# Patient Record
Sex: Male | Born: 1970 | Race: White | Hispanic: No | Marital: Married | Smoking: Never smoker
Health system: Southern US, Community
[De-identification: ages and names within clinical notes are randomized; demographics above are authoritative.]

## PROBLEM LIST (undated history)

## (undated) DIAGNOSIS — K219 Gastro-esophageal reflux disease without esophagitis: Secondary | ICD-10-CM

## (undated) DIAGNOSIS — J45909 Unspecified asthma, uncomplicated: Secondary | ICD-10-CM

## (undated) DIAGNOSIS — E785 Hyperlipidemia, unspecified: Secondary | ICD-10-CM

## (undated) DIAGNOSIS — Z8719 Personal history of other diseases of the digestive system: Secondary | ICD-10-CM

## (undated) DIAGNOSIS — T7840XA Allergy, unspecified, initial encounter: Secondary | ICD-10-CM

## (undated) HISTORY — PX: OTHER SURGICAL HISTORY: SHX169

## (undated) HISTORY — DX: Allergy, unspecified, initial encounter: T78.40XA

## (undated) HISTORY — DX: Unspecified asthma, uncomplicated: J45.909

## (undated) HISTORY — DX: Personal history of other diseases of the digestive system: Z87.19

## (undated) HISTORY — DX: Gastro-esophageal reflux disease without esophagitis: K21.9

## (undated) HISTORY — DX: Hyperlipidemia, unspecified: E78.5

---

## 2006-04-05 ENCOUNTER — Ambulatory Visit: Payer: Self-pay | Admitting: Internal Medicine

## 2006-04-08 ENCOUNTER — Ambulatory Visit: Payer: Self-pay | Admitting: Gastroenterology

## 2006-04-10 ENCOUNTER — Ambulatory Visit: Payer: Self-pay | Admitting: Gastroenterology

## 2006-05-09 ENCOUNTER — Ambulatory Visit: Payer: Self-pay | Admitting: Gastroenterology

## 2006-05-10 ENCOUNTER — Ambulatory Visit: Payer: Self-pay | Admitting: Gastroenterology

## 2006-07-09 ENCOUNTER — Ambulatory Visit: Payer: Self-pay | Admitting: Gastroenterology

## 2006-07-10 ENCOUNTER — Ambulatory Visit: Payer: Self-pay | Admitting: Gastroenterology

## 2007-01-23 ENCOUNTER — Ambulatory Visit: Payer: Self-pay | Admitting: Internal Medicine

## 2007-07-04 ENCOUNTER — Ambulatory Visit: Payer: Self-pay | Admitting: Gastroenterology

## 2007-07-04 LAB — CONVERTED CEMR LAB: Total Protein: 6.9 g/dL (ref 6.0–8.3)

## 2007-07-09 ENCOUNTER — Ambulatory Visit: Payer: Self-pay | Admitting: Gastroenterology

## 2007-07-15 ENCOUNTER — Ambulatory Visit: Payer: Self-pay | Admitting: Internal Medicine

## 2007-08-15 ENCOUNTER — Ambulatory Visit: Payer: Self-pay | Admitting: Internal Medicine

## 2007-08-15 LAB — CONVERTED CEMR LAB
ALT: 43 units/L (ref 0–53)
Alkaline Phosphatase: 84 units/L (ref 39–117)
HDL: 29.4 mg/dL — ABNORMAL LOW (ref >39.0)
LDL Cholesterol: 125 mg/dL — ABNORMAL HIGH (ref 0–99)
Triglycerides: 108 mg/dL (ref 0–149)
VLDL: 22 mg/dL (ref 0–40)

## 2007-10-07 ENCOUNTER — Encounter: Payer: Self-pay | Admitting: Internal Medicine

## 2007-10-07 DIAGNOSIS — Z8639 Personal history of other endocrine, nutritional and metabolic disease: Secondary | ICD-10-CM

## 2007-10-07 DIAGNOSIS — Z862 Personal history of diseases of the blood and blood-forming organs and certain disorders involving the immune mechanism: Secondary | ICD-10-CM

## 2008-03-16 ENCOUNTER — Ambulatory Visit: Payer: Self-pay | Admitting: Gastroenterology

## 2008-03-16 LAB — CONVERTED CEMR LAB
ALT: 46 U/L
AST: 26 U/L
Albumin: 3.9 g/dL
Alkaline Phosphatase: 81 U/L
Bilirubin, Direct: 0.1 mg/dL
Total Bilirubin: 0.9 mg/dL
Total Protein: 7.5 g/dL

## 2008-04-01 DIAGNOSIS — K7689 Other specified diseases of liver: Secondary | ICD-10-CM | POA: Insufficient documentation

## 2008-04-01 DIAGNOSIS — K760 Fatty (change of) liver, not elsewhere classified: Secondary | ICD-10-CM | POA: Insufficient documentation

## 2009-01-03 ENCOUNTER — Ambulatory Visit: Payer: Self-pay | Admitting: Internal Medicine

## 2009-01-03 DIAGNOSIS — K219 Gastro-esophageal reflux disease without esophagitis: Secondary | ICD-10-CM | POA: Insufficient documentation

## 2009-02-17 ENCOUNTER — Ambulatory Visit: Payer: Self-pay | Admitting: Internal Medicine

## 2009-02-22 LAB — CONVERTED CEMR LAB
Albumin: 4.2 g/dL (ref 3.5–5.2)
Basophils Relative: 0.3 % (ref 0.0–3.0)
Bilirubin Urine: NEGATIVE
Bilirubin, Direct: 0.2 mg/dL (ref 0.0–0.3)
CO2: 31 meq/L (ref 19–32)
Chloride: 104 meq/L (ref 96–112)
Direct LDL: 138.7 mg/dL
Eosinophils Absolute: 0.1 10*3/uL (ref 0.0–0.7)
Eosinophils Relative: 1.7 % (ref 0.0–5.0)
GFR calc Af Amer: 123 mL/min
GFR calc non Af Amer: 101 mL/min
Ketones, ur: NEGATIVE mg/dL
Lymphocytes Relative: 34.5 % (ref 12.0–46.0)
MCHC: 35.1 g/dL (ref 30.0–36.0)
MCV: 84.8 fL (ref 78.0–100.0)
Monocytes Absolute: 0.6 10*3/uL (ref 0.1–1.0)
Monocytes Relative: 7.4 % (ref 3.0–12.0)
Neutro Abs: 4.2 10*3/uL (ref 1.4–7.7)
Nitrite: NEGATIVE
RDW: 12.2 % (ref 11.5–14.6)
Sodium: 141 meq/L (ref 135–145)
Total Bilirubin: 1.1 mg/dL (ref 0.3–1.2)
Total CHOL/HDL Ratio: 6.1
Total Protein, Urine: NEGATIVE mg/dL
Triglycerides: 127 mg/dL (ref 0–149)
Urine Glucose: NEGATIVE mg/dL
pH: 6.5 (ref 5.0–8.0)

## 2009-03-03 ENCOUNTER — Telehealth: Payer: Self-pay | Admitting: Internal Medicine

## 2009-05-19 ENCOUNTER — Ambulatory Visit: Payer: Self-pay | Admitting: Internal Medicine

## 2009-11-18 ENCOUNTER — Ambulatory Visit: Payer: Self-pay | Admitting: Internal Medicine

## 2009-11-18 LAB — CONVERTED CEMR LAB
ALT: 34 units/L (ref 0–53)
BUN: 9 mg/dL (ref 6–23)
Bilirubin, Direct: 0.2 mg/dL (ref 0.0–0.3)
CO2: 28 meq/L (ref 19–32)
Chloride: 105 meq/L (ref 96–112)
Creatinine, Ser: 1 mg/dL (ref 0.4–1.5)
Glucose, Bld: 80 mg/dL (ref 70–99)
HCV Ab: NEGATIVE
HDL: 35.1 mg/dL — ABNORMAL LOW (ref >39.00)
Hep A IgM: NEGATIVE
Hep B C IgM: NEGATIVE
Hepatitis B Surface Ag: NEGATIVE
Potassium: 3.7 meq/L (ref 3.5–5.1)

## 2009-11-22 ENCOUNTER — Ambulatory Visit: Payer: Self-pay | Admitting: Internal Medicine

## 2009-11-22 DIAGNOSIS — R21 Rash and other nonspecific skin eruption: Secondary | ICD-10-CM

## 2009-11-22 DIAGNOSIS — E785 Hyperlipidemia, unspecified: Secondary | ICD-10-CM | POA: Insufficient documentation

## 2009-11-24 DIAGNOSIS — J309 Allergic rhinitis, unspecified: Secondary | ICD-10-CM | POA: Insufficient documentation

## 2010-06-06 ENCOUNTER — Ambulatory Visit: Payer: Self-pay | Admitting: Internal Medicine

## 2010-06-06 DIAGNOSIS — J45901 Unspecified asthma with (acute) exacerbation: Secondary | ICD-10-CM

## 2010-06-06 DIAGNOSIS — R0602 Shortness of breath: Secondary | ICD-10-CM

## 2010-11-24 ENCOUNTER — Ambulatory Visit: Payer: Self-pay | Admitting: Internal Medicine

## 2010-11-24 LAB — CONVERTED CEMR LAB
ALT: 45 units/L (ref 0–53)
AST: 23 units/L (ref 0–37)
Albumin: 4.1 g/dL (ref 3.5–5.2)
Basophils Absolute: 0 10*3/uL (ref 0.0–0.1)
Bilirubin, Direct: 0.1 mg/dL (ref 0.0–0.3)
CO2: 28 meq/L (ref 19–32)
Cholesterol: 214 mg/dL — ABNORMAL HIGH (ref 0–200)
Direct LDL: 147.8 mg/dL
Eosinophils Absolute: 0.2 10*3/uL (ref 0.0–0.7)
Eosinophils Relative: 2.2 % (ref 0.0–5.0)
HDL: 34 mg/dL — ABNORMAL LOW (ref >39.00)
Hemoglobin: 16 g/dL (ref 13.0–17.0)
Leukocytes, UA: NEGATIVE
Lymphs Abs: 1.9 10*3/uL (ref 0.7–4.0)
Monocytes Relative: 8.1 % (ref 3.0–12.0)
Potassium: 4.1 meq/L (ref 3.5–5.1)
Sodium: 139 meq/L (ref 135–145)
Total Bilirubin: 1 mg/dL (ref 0.3–1.2)
Total CHOL/HDL Ratio: 6
Total Protein, Urine: NEGATIVE mg/dL
Triglycerides: 141 mg/dL (ref 0.0–149.0)
Urine Glucose: NEGATIVE mg/dL
Urobilinogen, UA: 0.2 (ref 0.0–1.0)
VLDL: 28.2 mg/dL (ref 0.0–40.0)
pH: 6.5 (ref 5.0–8.0)

## 2010-11-27 ENCOUNTER — Ambulatory Visit: Payer: Self-pay | Admitting: Internal Medicine

## 2011-01-16 NOTE — Assessment & Plan Note (Signed)
Summary: BREATHING PROBLEM/NWS   Vital Signs:  Patient profile:   40 year old male Weight:      174 pounds BMI:     30.93 O2 Sat:      96 % on Room air Temp:     98.7 degrees F oral Pulse rate:   98 / minute BP sitting:   138 / 90  (left arm) Cuff size:   regular  Vitals Entered By: Lamar Sprinkles, CMA (June 06, 2010 8:57 AM)  O2 Flow:  Room air CC: SOB since last night after painting a wall in a closed room   CC:  SOB since last night after painting a wall in a closed room.  History of Present Illness: C/o SOB He was painting a wall w/acryl. paint last night and got very SOB and wheezy. He has spent all night outside; he was unable to lie down.Marland KitchenMarland KitchenC/o some cough. No CP.  Allergies: No Known Drug Allergies  Past History:  Past Surgical History: Last updated: 04/01/2008 Left leg surgery/ Left hip surgery.  Social History: Last updated: 01/03/2009 Occupation: Married w 1 son Never Smoked Alcohol use-no Regular exercise-yes  Past Medical History: Hx of FATTY LIVER DISEASE (ICD-571.8) LIVER FUNCTION TESTS, ABNORMAL, HX OF (ICD-V12.2) Dr Christella Hartigan FAMILY HISTORY-MOTHER BREAST CANCER GERD mild Hyperlipidemia, mild Allergic rhinitis Asthmatic bronchitis acute due to Latex paint  Review of Systems  The patient denies fever, chest pain, syncope, dyspnea on exertion, and abdominal pain.    Physical Exam  General:  Im mild resp distress Nose:  WNL Mouth:  WNL Neck:  No deformities, masses, or tenderness noted. Lungs:  B rhonchi Heart:  Normal rate and regular rhythm. S1 and S2 normal without gallop, murmur, click, rub or other extra sounds. Abdomen:  Bowel sounds positive,abdomen soft and non-tender without masses, organomegaly or hernias noted. Skin:  no cyanosis Psych:  Oriented X3.     Impression & Recommendations:  Problem # 1:  DYSPNEA (ICD-786.05) due to #2 Assessment New Better after HHN Orders: T-2 View CXR, Same Day (71020.5TC) Depo- Medrol 80mg   (J1040) Depo- Medrol 40mg  (J1030) Admin of Therapeutic Inj  intramuscular or subcutaneous (81191) Nebulizer Tx (47829)  Problem # 2:  ASTHMA UNSPECIFIED WITH EXACERBATION (FAO-130.86) Assessment: New  His updated medication list for this problem includes:    Symbicort 160-4.5 Mcg/act Aero (Budesonide-formoterol fumarate) .Marland Kitchen... 2 inh two times a day    Proair Hfa 108 (90 Base) Mcg/act Aers (Albuterol sulfate) .Marland Kitchen... 2 inh qid as needed  Complete Medication List: 1)  Lovaza 1 Gm Caps (Omega-3-acid ethyl esters) .... 2 two times a day 2)  Loratadine 10 Mg Tabs (Loratadine) .Marland Kitchen.. 1 by mouth once daily as needed allergies 3)  Flonase 50 Mcg/act Susp (Fluticasone propionate) .Marland Kitchen.. 1-2 spr each nostril once daily for allergies 4)  Lovaza 1 Gm Caps (Omega-3-acid ethyl esters) .... 2 by mouth bid 5)  Vitamin D 1000 Unit Tabs (Cholecalciferol) .Marland Kitchen.. 1 by mouth qd 6)  Triamcinolone Acetonide 0.5 % Crea (Triamcinolone acetonide) .... Use two times a day as needed for rash 7)  Symbicort 160-4.5 Mcg/act Aero (Budesonide-formoterol fumarate) .... 2 inh two times a day 8)  Proair Hfa 108 (90 Base) Mcg/act Aers (Albuterol sulfate) .... 2 inh qid as needed  Patient Instructions: 1)  RTC 2 d 2)  Call if you are not better in a reasonable amount of time or if worse. Go to ER if feeling really bad! Prescriptions: PROAIR HFA 108 (90 BASE) MCG/ACT AERS (ALBUTEROL SULFATE)  2 inh qid as needed  #3 x 3   Entered and Authorized by:   Tresa Garter MD   Signed by:   Tresa Garter MD on 06/06/2010   Method used:   Print then Give to Patient   RxID:   [id number] SYMBICORT 160-4.5 MCG/ACT AERO (BUDESONIDE-FORMOTEROL FUMARATE) 2 inh two times a day  #1 x 3   Entered and Authorized by:   Tresa Garter MD   Signed by:   Tresa Garter MD on 06/06/2010   Method used:   Print then Give to Patient   RxID:   [id number]    Medication Administration  Injection # 1:    Medication:  Depo- Medrol 80mg     Diagnosis: DYSPNEA (ICD-786.05)    Route: IM    Site: RUOQ gluteus    Exp Date: 03/17/2013    Lot #: [id number]    Mfr: Pharmacia    Comments: 120mg      Patient tolerated injection without complications    Given by: Lamar Sprinkles, CMA (June 06, 2010 11:46 AM)  Injection # 2:    Medication: Depo- Medrol 40mg     Diagnosis: DYSPNEA (ICD-786.05)    Comments: Same as above    Patient tolerated injection without complications    Given by: Lamar Sprinkles, CMA (June 06, 2010 11:47 AM)  Orders Added: 1)  T-2 View CXR, Same Day [71020.5TC] 2)  Depo- Medrol 80mg  [J1040] 3)  Depo- Medrol 40mg  [J1030] 4)  Admin of Therapeutic Inj  intramuscular or subcutaneous [96372] 5)  Nebulizer Tx [94640] 6)  Est. Patient Level IV [84696]

## 2011-01-18 NOTE — Assessment & Plan Note (Signed)
Summary: CPX / NWS  #   Vital Signs:  Patient profile:   40 year old male Height:      63 inches Weight:      179 pounds BMI:     31.82 Temp:     98.1 degrees F oral Pulse rate:   92 / minute Pulse rhythm:   regular Resp:     16 per minute BP sitting:   130 / 90  (left arm) Cuff size:   regular  Vitals Entered By: Lanier Prude, Beverly Gust) (November 27, 2010 2:05 PM) CC: CPX Is Patient Diabetic? No Comments pt is not taking Loratadine, Flonase, Triamcinolone or ProAir   CC:  CPX.  History of Present Illness: The patient presents for a preventive health examination   Current Medications (verified): 1)  Lovaza 1 Gm  Caps (Omega-3-Acid Ethyl Esters) .... 2 Two Times A Day 2)  Loratadine 10 Mg Tabs (Loratadine) .Marland Kitchen.. 1 By Mouth Once Daily As Needed Allergies 3)  Flonase 50 Mcg/act Susp (Fluticasone Propionate) .Marland Kitchen.. 1-2 Spr Each Nostril Once Daily For Allergies 4)  Lovaza 1 Gm Caps (Omega-3-Acid Ethyl Esters) .... 2 By Mouth Bid 5)  Vitamin D 1000 Unit Tabs (Cholecalciferol) .Marland Kitchen.. 1 By Mouth Qd 6)  Triamcinolone Acetonide 0.5 % Crea (Triamcinolone Acetonide) .... Use Two Times A Day As Needed For Rash 7)  Symbicort 160-4.5 Mcg/act Aero (Budesonide-Formoterol Fumarate) .... 2 Inh Two Times A Day As Needed 8)  Proair Hfa 108 (90 Base) Mcg/act Aers (Albuterol Sulfate) .... 2 Inh Qid As Needed  Allergies (verified): No Known Drug Allergies  Past History:  Past Medical History: Last updated: 06/06/2010 Hx of FATTY LIVER DISEASE (ICD-571.8) LIVER FUNCTION TESTS, ABNORMAL, HX OF (ICD-V12.2) Dr Christella Hartigan FAMILY HISTORY-MOTHER BREAST CANCER GERD mild Hyperlipidemia, mild Allergic rhinitis Asthmatic bronchitis acute due to Latex paint  Past Surgical History: Last updated: 04/01/2008 Left leg surgery/ Left hip surgery.  Family History: Last updated: 01/03/2009 No CVA, MIs  Social History: Occupation: Magazine features editor Married w 1 son Never Smoked Alcohol use-no Regular  exercise-yes - gym  Review of Systems  The patient denies anorexia, fever, weight loss, weight gain, vision loss, decreased hearing, hoarseness, chest pain, syncope, dyspnea on exertion, peripheral edema, prolonged cough, headaches, hemoptysis, abdominal pain, melena, hematochezia, severe indigestion/heartburn, hematuria, incontinence, genital sores, muscle weakness, suspicious skin lesions, transient blindness, difficulty walking, depression, unusual weight change, abnormal bleeding, enlarged lymph nodes, angioedema, and testicular masses.    Physical Exam  General:  Im mild resp distress Head:  Normocephalic and atraumatic without obvious abnormalities. No apparent alopecia or balding. Eyes:  No corneal or conjunctival inflammation noted. EOMI. Perrla.  Ears:  External ear exam shows no significant lesions or deformities.  Otoscopic examination reveals clear canals, tympanic membranes are intact bilaterally without bulging, retraction, inflammation or discharge. Hearing is grossly normal bilaterally. Nose:  External nasal examination shows no deformity or inflammation. Nasal mucosa are pink and moist without lesions or exudates. Mouth:  Oral mucosa and oropharynx without lesions or exudates.  Teeth in good repair. Neck:  No deformities, masses, or tenderness noted. Lungs:  Normal respiratory effort, chest expands symmetrically. Lungs are clear to auscultation, no crackles or wheezes. Heart:  Normal rate and regular rhythm. S1 and S2 normal without gallop, murmur, click, rub or other extra sounds. Abdomen:  Bowel sounds positive,abdomen soft and non-tender without masses, organomegaly or hernias noted. Genitalia:  self nl Msk:  No deformity or scoliosis noted of thoracic or lumbar spine.   Pulses:  R and L carotid,radial,femoral,dorsalis pedis and posterior tibial pulses are full and equal bilaterally Extremities:  No clubbing, cyanosis, edema, or deformity noted with normal full range of  motion of all joints.   Neurologic:  No cranial nerve deficits noted. Station and gait are normal. Plantar reflexes are down-going bilaterally. DTRs are symmetrical throughout. Sensory, motor and coordinative functions appear intact. Skin:  Intact without suspicious lesions or rashes Cervical Nodes:  No lymphadenopathy noted Psych:  Cognition and judgment appear intact. Alert and cooperative with normal attention span and concentration. No apparent delusions, illusions, hallucinations   Impression & Recommendations:  Problem # 1:  WELL ADULT EXAM (ICD-V70.0) Assessment New Health and age related issues were discussed. Available screening tests and vaccinations were discussed as well. Healthy life style including good diet and exercise was discussed.  The labs were reviewed with the patient.   Problem # 2:  HYPERLIPIDEMIA (ICD-272.4) Assessment: Unchanged He is not interested in statins The following medications were removed from the medication list:    Lovaza 1 Gm Caps (Omega-3-acid ethyl esters) .Marland Kitchen... 2 two times a day His updated medication list for this problem includes:    Lovaza 1 Gm Caps (Omega-3-acid ethyl esters) .Marland Kitchen... 2 by mouth bid  Problem # 3:  ALLERGIC RHINITIS (ICD-477.9) Assessment: Improved  The following medications were removed from the medication list:    Loratadine 10 Mg Tabs (Loratadine) .Marland Kitchen... 1 by mouth once daily as needed allergies    Flonase 50 Mcg/act Susp (Fluticasone propionate) .Marland Kitchen... 1-2 spr each nostril once daily for allergies  Problem # 4:  LIVER FUNCTION TESTS, ABNORMAL, HX OF (ICD-V12.2) Assessment: Improved  Complete Medication List: 1)  Lovaza 1 Gm Caps (Omega-3-acid ethyl esters) .... 2 by mouth bid 2)  Vitamin D 1000 Unit Tabs (Cholecalciferol) .Marland Kitchen.. 1 by mouth qd 3)  Omeprazole 40 Mg Cpdr (Omeprazole) .Marland Kitchen.. 1 by mouth qam for indigestion  Patient Instructions: 1)  Please schedule a follow-up appointment in 6 months. 2)  BMP prior to visit,  ICD-9: 3)  Hepatic Panel prior to visit, ICD-9: 4)  Lipid Panel prior to visit, ICD-9:272.20  995.20 Prescriptions: OMEPRAZOLE 40 MG CPDR (OMEPRAZOLE) 1 by mouth qam for indigestion  #30 x 3   Entered and Authorized by:   Tresa Garter MD   Signed by:   Tresa Garter MD on 11/27/2010   Method used:   Print then Give to Patient   RxID:   [id number] SYMBICORT 160-4.5 MCG/ACT AERO (BUDESONIDE-FORMOTEROL FUMARATE) 2 inh two times a day as needed  #1 x 3   Entered and Authorized by:   Tresa Garter MD   Signed by:   Tresa Garter MD on 11/27/2010   Method used:   Print then Give to Patient   RxID:   [id number] LOVAZA 1 GM  CAPS (OMEGA-3-ACID ETHYL ESTERS) 2 two times a day  #120 x 12   Entered and Authorized by:   Tresa Garter MD   Signed by:   Tresa Garter MD on 11/27/2010   Method used:   Print then Give to Patient   RxID:   [id number]    Orders Added: 1)  Est. Patient age 18-39 [99395]

## 2011-05-01 NOTE — Assessment & Plan Note (Signed)
HEALTHCARE                         GASTROENTEROLOGY OFFICE NOTE   NAME:[name], [name]                        MRN:          [id number]  DATE:07/09/2007                            DOB:          April 05, 1971    GI PROBLEM LIST:  1. Abnormal liver tests.  First noted abnormal transaminases in 2004.  In 2007 his AST was 81, his  ALT was 71, GGT was 271, otherwise normal.  May 2007:  AST of 60, ALT of  244.  Normal right upper quadrant ultrasound and normal appearing liver  and no gallstones April 2007.  Labs April 2007 show hepatitis A, B, C,  negative, alpha antitrypsin normal, ceruloplasmin normal, ANA negative,  AMA negative, antismooth muscle antibody negative, iron studies normal,  thyroid studies normal.  Liver tests improved as patient lost weight.  Transaminase July 2007 after a 10 to 15 pound weight loss show ALT 42,  AST 26.   INTERVAL HISTORY:  I saw [name] about a year ago.  He had been losing  weight, and his liver tests responded quite well.  I suspect that he  indeed had mild fatty liver disease.  He had some blood tested at work  recently on an elective basis, and it showed an ALT of 171, an AST of  55, and GGT of 234.  [name], [name] (each of these transaminases  are 2 units above normal).  Other liver tests were normal last week.  He  has had no jaundice.  No abdominal pains.  No biliary symptoms.  He  takes a multivitamin once a day.  Otherwise, no herbs, no vitamins, no  Tylenol.   CURRENT MEDICATIONS:  None.   PHYSICAL EXAMINATION:  Weight 164 pounds, which is down 1 pound in the  past year.  Blood pressure 108/78.  Pulse 72.  CONSTITUTIONAL:  Generally well appearing.  Anicteric sclerae.  ABDOMEN:  Soft, nontender, non-distended, normal bowel sounds.   ASSESSMENT AND PLAN:  A 40 year old man with abnormal liver tests.   His AST, ALT, and GGT checked at  work 2 weeks ago were quite elevated.  Rechecked here at [facility], they were just barely above normal.  Fatty  liver disease should not cause such a waxing and waning of liver tests.  Biliary processes can, but he has normal right upper quadrant  ultrasound, and has had no biliary-type pains.  This makes me wonder  whether the work liver tests were indeed accurate.  He has had a very  thorough workup all summarized above, and I do not see any reason to  repeat that yet.  However, I will arrange for him to have hepatic  profile  done again in 1 month from now.  If his liver tests are significantly  elevated again, I will arrange for a repeat ultrasound.  Would also have  to consider liver biopsy at that point.     Richard Fee, MD  Electronically Signed    DPJ/MedQ  DD: 07/09/2007  DT:  07/09/2007  Job #: [id number]   cc:   Richard Quint. Plotnikov, MD

## 2011-05-04 NOTE — Assessment & Plan Note (Signed)
[facility]                           GASTROENTEROLOGY OFFICE NOTE   NAME:[name], [name]                        MRN:          [id number]  DATE:07/10/2006                            DOB:          September 04, 1971    GI PROBLEM LIST:  1.  Abnormal liver tests.  First noted abnormal transaminases in 2004.  In      2007, AST 81, ALT 71, GGT 271, normal otherwise.  May 2007, AST 60, ALT      244.   WORKUP TO DATE:  Normal right upper quadrant ultrasound without stones and  normal-appearing liver April 2007.  Labs April 2007 Hep A, B, C negative.  Alpha-1 antitrypsin level normal.  Ceruloplasmin normal.  ANA negative.  AMA  negative.  Anti-smooth muscle antibody normal.  Iron studies normal, thyroid  studies normal.   INTERVAL HISTORY:  I last saw [name] two months ago.  Given the pretty  significant elevation in his transaminases and negative workup, I recommend  that we proceed with liver biopsy.  He however wanted to try losing weight  for a few weeks to see if that made a difference in his liver tests.  He  comes in today weighing 10 pounds less by cutting out extra food and fats in  his diet.  His liver tests checked yesterday were much improved.  Specifically, his alkaline phosphatase was 86, his AST was 26, his ALT was  42.  These are all remarkably better than they were three months prior.   MEDICATIONS:  None.   PHYSICAL EXAMINATION:  VITAL SIGNS:  Weight 165 pounds (down 10 pounds since  last visit).  Blood pressure 110/60.  Pulse 68.  CONSTITUTIONAL:  Generally well appearing.  ABDOMEN:  Soft, non-tender, non-distended, normal bowel sounds.   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   [name] has done remarkably well with a weight loss program in the past two  months and his liver tests have dramatically improved.  I think this is very  good evidence that he indeed did have fatty liver  disease.  He will return  to see me in six months' time.  He said he wants to lose another 10-15  pounds, and I encouraged him to do that.  He is not obese but probably is  overweight.  He will return to see me therefore in 6 months' time.  He will  have his labs checked  the day prior and if at that point his liver tests are still looking as good  as they are now, then I think we could follow up with him on an as needed  basis only.                                   Rachael Fee, MD   DPJ/MedQ  DD:  07/10/2006  DT:  07/10/2006  Job #:  [id number]   cc:   Sonda Primes, MD

## 2011-05-22 ENCOUNTER — Other Ambulatory Visit: Payer: Self-pay

## 2011-05-22 ENCOUNTER — Other Ambulatory Visit: Payer: Self-pay | Admitting: Internal Medicine

## 2011-05-22 DIAGNOSIS — E782 Mixed hyperlipidemia: Secondary | ICD-10-CM

## 2011-05-22 DIAGNOSIS — T887XXA Unspecified adverse effect of drug or medicament, initial encounter: Secondary | ICD-10-CM

## 2011-05-28 ENCOUNTER — Ambulatory Visit: Payer: Self-pay | Admitting: Internal Medicine

## 2011-07-03 ENCOUNTER — Other Ambulatory Visit: Payer: Self-pay | Admitting: Internal Medicine

## 2011-07-31 ENCOUNTER — Other Ambulatory Visit (INDEPENDENT_AMBULATORY_CARE_PROVIDER_SITE_OTHER): Payer: BC Managed Care – PPO

## 2011-07-31 DIAGNOSIS — E782 Mixed hyperlipidemia: Secondary | ICD-10-CM

## 2011-07-31 DIAGNOSIS — T887XXA Unspecified adverse effect of drug or medicament, initial encounter: Secondary | ICD-10-CM

## 2011-07-31 LAB — BASIC METABOLIC PANEL
CO2: 28 mEq/L (ref 19–32)
Calcium: 9.2 mg/dL (ref 8.4–10.5)
GFR: 76.91 mL/min (ref >60.00)
Potassium: 4 mEq/L (ref 3.5–5.1)
Sodium: 141 mEq/L (ref 135–145)

## 2011-07-31 LAB — LIPID PANEL
HDL: 41.5 mg/dL (ref >39.00)
Total CHOL/HDL Ratio: 4
VLDL: 19.4 mg/dL (ref 0.0–40.0)

## 2011-07-31 LAB — HEPATIC FUNCTION PANEL
Alkaline Phosphatase: 74 U/L (ref 39–117)
Bilirubin, Direct: 0.1 mg/dL (ref 0.0–0.3)
Total Protein: 7.2 g/dL (ref 6.0–8.3)

## 2011-08-01 ENCOUNTER — Ambulatory Visit (INDEPENDENT_AMBULATORY_CARE_PROVIDER_SITE_OTHER): Payer: BC Managed Care – PPO | Admitting: Internal Medicine

## 2011-08-01 ENCOUNTER — Encounter: Payer: Self-pay | Admitting: Internal Medicine

## 2011-08-01 DIAGNOSIS — K219 Gastro-esophageal reflux disease without esophagitis: Secondary | ICD-10-CM

## 2011-08-01 DIAGNOSIS — D485 Neoplasm of uncertain behavior of skin: Secondary | ICD-10-CM | POA: Insufficient documentation

## 2011-08-01 DIAGNOSIS — K7689 Other specified diseases of liver: Secondary | ICD-10-CM

## 2011-08-01 DIAGNOSIS — E785 Hyperlipidemia, unspecified: Secondary | ICD-10-CM

## 2011-08-01 NOTE — Assessment & Plan Note (Signed)
Better  

## 2011-08-01 NOTE — Assessment & Plan Note (Signed)
LFTs are better 

## 2011-08-01 NOTE — Progress Notes (Signed)
  Subjective:    Patient ID: [name], male    DOB: July 25, 1971, 40 y.o.   MRN: [id number]  HPI  The patient presents for a follow-up of  chronic fatty liver, chronic dyslipidemia, GERD  C/o a spot on L shin x 2 years or so    Review of Systems  Constitutional: Negative for appetite change, fatigue and unexpected weight change.  HENT: Negative for nosebleeds, congestion, sore throat, sneezing, trouble swallowing and neck pain.   Eyes: Negative for itching and visual disturbance.  Respiratory: Negative for cough.   Cardiovascular: Negative for chest pain, palpitations and leg swelling.  Gastrointestinal: Negative for nausea, diarrhea, blood in stool and abdominal distention.  Genitourinary: Negative for frequency and hematuria.  Musculoskeletal: Negative for back pain, joint swelling and gait problem.  Skin: Negative for rash.  Neurological: Negative for dizziness, tremors, speech difficulty and weakness.  Psychiatric/Behavioral: Negative for sleep disturbance, dysphoric mood and agitation. The patient is not nervous/anxious.        Objective:   Physical Exam  Constitutional: He is oriented to person, place, and time. He appears well-developed.  HENT:  Mouth/Throat: Oropharynx is clear and moist.  Eyes: Conjunctivae are normal. Pupils are equal, round, and reactive to light.  Neck: Normal range of motion. No JVD present. No thyromegaly present.  Cardiovascular: Normal rate, regular rhythm, normal heart sounds and intact distal pulses.  Exam reveals no gallop and no friction rub.   No murmur heard. Pulmonary/Chest: Effort normal and breath sounds normal. No respiratory distress. He has no wheezes. He has no rales. He exhibits no tenderness.  Abdominal: Soft. Bowel sounds are normal. He exhibits no distension and no mass. There is no tenderness. There is no rebound and no guarding.  Musculoskeletal: Normal range of motion. He exhibits no edema and no tenderness.  Lymphadenopathy:     He has no cervical adenopathy.  Neurological: He is alert and oriented to person, place, and time. He has normal reflexes. No cranial nerve deficit. He exhibits normal muscle tone. Coordination normal.  Skin: Skin is warm and dry. No rash noted.       L shin lesion, flat  Psychiatric: He has a normal mood and affect. His behavior is normal. Judgment and thought content normal.     7x6 mm L dist shin     Assessment & Plan:

## 2011-08-01 NOTE — Assessment & Plan Note (Signed)
Likely benign. Bx offered - he declined. Lesion was measured.

## 2011-10-27 ENCOUNTER — Other Ambulatory Visit: Payer: Self-pay | Admitting: Internal Medicine

## 2012-02-01 ENCOUNTER — Encounter: Payer: BC Managed Care – PPO | Admitting: Internal Medicine

## 2012-02-06 ENCOUNTER — Other Ambulatory Visit: Payer: Self-pay

## 2012-02-06 MED ORDER — OMEGA-3-ACID ETHYL ESTERS 1 G PO CAPS: 2.0000 g | ORAL_CAPSULE | Freq: Two times a day (BID) | ORAL | Status: DC

## 2012-04-04 ENCOUNTER — Other Ambulatory Visit: Payer: Self-pay | Admitting: Internal Medicine

## 2012-04-11 ENCOUNTER — Encounter: Payer: BC Managed Care – PPO | Admitting: Internal Medicine

## 2012-06-24 ENCOUNTER — Encounter: Payer: BC Managed Care – PPO | Admitting: Internal Medicine

## 2012-08-28 ENCOUNTER — Other Ambulatory Visit (INDEPENDENT_AMBULATORY_CARE_PROVIDER_SITE_OTHER): Payer: BC Managed Care – PPO

## 2012-08-28 ENCOUNTER — Other Ambulatory Visit: Payer: Self-pay | Admitting: *Deleted

## 2012-08-28 DIAGNOSIS — Z0000 Encounter for general adult medical examination without abnormal findings: Secondary | ICD-10-CM

## 2012-08-28 DIAGNOSIS — Z79899 Other long term (current) drug therapy: Secondary | ICD-10-CM

## 2012-08-28 LAB — URINALYSIS, ROUTINE W REFLEX MICROSCOPIC
Ketones, ur: NEGATIVE
Leukocytes, UA: NEGATIVE
Nitrite: NEGATIVE
Specific Gravity, Urine: 1.02 (ref 1.000–1.030)
Total Protein, Urine: NEGATIVE
pH: 6 (ref 5.0–8.0)

## 2012-08-28 LAB — CBC WITH DIFFERENTIAL/PLATELET
Basophils Absolute: 0 10*3/uL (ref 0.0–0.1)
Basophils Relative: 0.7 % (ref 0.0–3.0)
Eosinophils Absolute: 0.2 10*3/uL (ref 0.0–0.7)
HCT: 46.6 % (ref 39.0–52.0)
Hemoglobin: 16 g/dL (ref 13.0–17.0)
Lymphs Abs: 3 10*3/uL (ref 0.7–4.0)
MCHC: 34.3 g/dL (ref 30.0–36.0)
Monocytes Relative: 7.1 % (ref 3.0–12.0)
Neutro Abs: 2.6 10*3/uL (ref 1.4–7.7)
RDW: 13 % (ref 11.5–14.6)

## 2012-08-29 LAB — HEPATIC FUNCTION PANEL
Albumin: 4.4 g/dL (ref 3.5–5.2)
Alkaline Phosphatase: 70 U/L (ref 39–117)
Total Bilirubin: 1 mg/dL (ref 0.3–1.2)

## 2012-08-29 LAB — BASIC METABOLIC PANEL
BUN: 11 mg/dL (ref 6–23)
Chloride: 104 mEq/L (ref 96–112)
Potassium: 4.5 mEq/L (ref 3.5–5.1)
Sodium: 139 mEq/L (ref 135–145)

## 2012-08-29 LAB — LIPID PANEL: VLDL: 32.4 mg/dL (ref 0.0–40.0)

## 2012-09-01 ENCOUNTER — Ambulatory Visit (INDEPENDENT_AMBULATORY_CARE_PROVIDER_SITE_OTHER): Payer: BC Managed Care – PPO | Admitting: Internal Medicine

## 2012-09-01 ENCOUNTER — Encounter: Payer: Self-pay | Admitting: Internal Medicine

## 2012-09-01 VITALS — BP 118/80 | HR 72 | Temp 98.2°F | Resp 16 | Ht 62.0 in | Wt 178.0 lb

## 2012-09-01 DIAGNOSIS — B353 Tinea pedis: Secondary | ICD-10-CM

## 2012-09-01 DIAGNOSIS — Z862 Personal history of diseases of the blood and blood-forming organs and certain disorders involving the immune mechanism: Secondary | ICD-10-CM

## 2012-09-01 DIAGNOSIS — Z0000 Encounter for general adult medical examination without abnormal findings: Secondary | ICD-10-CM

## 2012-09-01 DIAGNOSIS — Z8639 Personal history of other endocrine, nutritional and metabolic disease: Secondary | ICD-10-CM

## 2012-09-01 DIAGNOSIS — E785 Hyperlipidemia, unspecified: Secondary | ICD-10-CM

## 2012-09-01 DIAGNOSIS — Z23 Encounter for immunization: Secondary | ICD-10-CM

## 2012-09-01 DIAGNOSIS — K219 Gastro-esophageal reflux disease without esophagitis: Secondary | ICD-10-CM

## 2012-09-01 DIAGNOSIS — D485 Neoplasm of uncertain behavior of skin: Secondary | ICD-10-CM

## 2012-09-01 MED ORDER — KETOCONAZOLE 200 MG PO TABS: 200.0000 mg | ORAL_TABLET | Freq: Every day | ORAL | Status: DC

## 2012-09-01 MED ORDER — OMEGA-3-ACID ETHYL ESTERS 1 G PO CAPS: 2.0000 g | ORAL_CAPSULE | Freq: Two times a day (BID) | ORAL | Status: DC

## 2012-09-01 MED ORDER — KETOCONAZOLE 2 % EX CREA: TOPICAL_CREAM | Freq: Every day | CUTANEOUS | Status: DC

## 2012-09-01 NOTE — Progress Notes (Signed)
   Subjective:    Patient ID: [name], male    DOB: 08/27/1971, 41 y.o.   MRN: [id number]  HPI  The patient presents for a follow-up of  chronic fatty liver, chronic dyslipidemia, GERD  F/u a spot on L shin x 2 years or so  BP Readings from Last 3 Encounters:  09/01/12 118/80  08/01/11 120/86  11/27/10 130/90   Wt Readings from Last 3 Encounters:  09/01/12 178 lb (80.74 kg)  08/01/11 175 lb (79.379 kg)  11/27/10 179 lb (81.194 kg)      Review of Systems  Constitutional: Negative for appetite change, fatigue and unexpected weight change.  HENT: Negative for nosebleeds, congestion, sore throat, sneezing, trouble swallowing and neck pain.   Eyes: Negative for itching and visual disturbance.  Respiratory: Negative for cough.   Cardiovascular: Negative for chest pain, palpitations and leg swelling.  Gastrointestinal: Negative for nausea, diarrhea, blood in stool and abdominal distention.  Genitourinary: Negative for frequency and hematuria.  Musculoskeletal: Negative for back pain, joint swelling and gait problem.  Skin: Negative for rash.  Neurological: Negative for dizziness, tremors, speech difficulty and weakness.  Psychiatric/Behavioral: Negative for disturbed wake/sleep cycle, dysphoric mood and agitation. The patient is not nervous/anxious.        Objective:   Physical Exam  Constitutional: He is oriented to person, place, and time. He appears well-developed.  HENT:  Mouth/Throat: Oropharynx is clear and moist.  Eyes: Conjunctivae normal are normal. Pupils are equal, round, and reactive to light.  Neck: Normal range of motion. No JVD present. No thyromegaly present.  Cardiovascular: Normal rate, regular rhythm, normal heart sounds and intact distal pulses.  Exam reveals no gallop and no friction rub.   No murmur heard. Pulmonary/Chest: Effort normal and breath sounds normal. No respiratory distress. He has no wheezes. He has no rales. He exhibits no tenderness.    Abdominal: Soft. Bowel sounds are normal. He exhibits no distension and no mass. There is no tenderness. There is no rebound and no guarding.  Musculoskeletal: Normal range of motion. He exhibits no edema and no tenderness.  Lymphadenopathy:    He has no cervical adenopathy.  Neurological: He is alert and oriented to person, place, and time. He has normal reflexes. No cranial nerve deficit. He exhibits normal muscle tone. Coordination normal.  Skin: Skin is warm and dry. No rash noted.       L shin lesion, flat  Psychiatric: He has a normal mood and affect. His behavior is normal. Judgment and thought content normal.   T. Pedis rash R foot  7x6 mm L dist shin  Lab Results  Component Value Date   WBC 6.3 08/28/2012   HGB 16.0 08/28/2012   HCT 46.6 08/28/2012   PLT 257.0 08/28/2012   GLUCOSE 82 08/28/2012   CHOL 207* 08/28/2012   TRIG 162.0* 08/28/2012   HDL 34.90* 08/28/2012   LDLDIRECT 136.2 08/28/2012   LDLCALC 111* 07/31/2011   ALT 40 08/28/2012   AST 24 08/28/2012   NA 139 08/28/2012   K 4.5 08/28/2012   CL 104 08/28/2012   CREATININE 1.0 08/28/2012   BUN 11 08/28/2012   CO2 25 08/28/2012   TSH 1.78 08/28/2012       Assessment & Plan:

## 2012-09-01 NOTE — Assessment & Plan Note (Signed)
Ketocon cream Ketocon po

## 2012-09-01 NOTE — Assessment & Plan Note (Signed)
schedule bx

## 2012-09-01 NOTE — Assessment & Plan Note (Signed)
Continue with current prescription therapy as reflected on the Med list. Loose wt 

## 2012-09-01 NOTE — Assessment & Plan Note (Signed)
Doing well 

## 2012-09-01 NOTE — Assessment & Plan Note (Signed)
Better  

## 2012-09-04 DIAGNOSIS — Z0000 Encounter for general adult medical examination without abnormal findings: Secondary | ICD-10-CM | POA: Insufficient documentation

## 2012-09-04 NOTE — Assessment & Plan Note (Signed)
We discussed age appropriate health related issues, including available/recomended screening tests and vaccinations. We discussed a need for adhering to healthy diet and exercise. Labs/EKG were reviewed/ordered. All questions were answered.   

## 2012-11-03 ENCOUNTER — Other Ambulatory Visit: Payer: Self-pay | Admitting: Internal Medicine

## 2012-11-05 ENCOUNTER — Ambulatory Visit: Payer: BC Managed Care – PPO | Admitting: Internal Medicine

## 2012-12-01 ENCOUNTER — Ambulatory Visit: Payer: BC Managed Care – PPO | Admitting: Internal Medicine

## 2013-02-10 ENCOUNTER — Encounter: Payer: Self-pay | Admitting: Internal Medicine

## 2013-02-10 ENCOUNTER — Ambulatory Visit (INDEPENDENT_AMBULATORY_CARE_PROVIDER_SITE_OTHER): Payer: BC Managed Care – PPO | Admitting: Internal Medicine

## 2013-02-10 VITALS — BP 130/90 | HR 72 | Temp 98.0°F | Resp 16 | Wt 176.0 lb

## 2013-02-10 DIAGNOSIS — J45901 Unspecified asthma with (acute) exacerbation: Secondary | ICD-10-CM

## 2013-02-10 MED ORDER — FLUTICASONE-SALMETEROL 100-50 MCG/DOSE IN AEPB: 1.0000 | INHALATION_SPRAY | Freq: Two times a day (BID) | RESPIRATORY_TRACT | Status: DC

## 2013-02-10 MED ORDER — AZITHROMYCIN 250 MG PO TABS: ORAL_TABLET | ORAL | Status: DC

## 2013-02-10 MED ORDER — PROMETHAZINE-CODEINE 6.25-10 MG/5ML PO SYRP: 5.0000 mL | ORAL_SOLUTION | ORAL | Status: DC | PRN

## 2013-02-10 MED ORDER — METHYLPREDNISOLONE ACETATE 80 MG/ML IJ SUSP
80.0000 mg | Freq: Once | INTRAMUSCULAR | Status: AC
  Administered 2013-02-10: 80 mg via INTRAMUSCULAR

## 2013-02-10 NOTE — Progress Notes (Signed)
  Subjective:    Patient ID: [name], male    DOB: 11-13-1971, 42 y.o.   MRN: [id number]  Cough This is a new problem. The current episode started 1 to 4 weeks ago. The problem has been unchanged. The problem occurs every few minutes. The cough is non-productive. Pertinent negatives include no chest pain, chills or wheezing. He has tried cool air and OTC cough suppressant for the symptoms. The treatment provided no relief. His past medical history is significant for asthma.      Review of Systems  Constitutional: Negative for chills, diaphoresis and fatigue.  Respiratory: Positive for cough. Negative for wheezing and stridor.   Cardiovascular: Negative for chest pain.  Genitourinary: Negative for urgency.  Neurological: Negative for dizziness.       Objective:   Physical Exam  Constitutional: He is oriented to person, place, and time. He appears well-developed.  HENT:  Mouth/Throat: Oropharynx is clear and moist.  Eyes: Conjunctivae are normal. Pupils are equal, round, and reactive to light.  Neck: Normal range of motion. No JVD present. No thyromegaly present.  Cardiovascular: Normal rate, regular rhythm, normal heart sounds and intact distal pulses.  Exam reveals no gallop and no friction rub.   No murmur heard. Pulmonary/Chest: Effort normal and breath sounds normal. No respiratory distress. He has no wheezes. He has no rales. He exhibits no tenderness.  Abdominal: Soft. Bowel sounds are normal. He exhibits no distension and no mass. There is no tenderness. There is no rebound and no guarding.  Musculoskeletal: Normal range of motion. He exhibits no edema and no tenderness.  Lymphadenopathy:    He has no cervical adenopathy.  Neurological: He is alert and oriented to person, place, and time. He has normal reflexes. No cranial nerve deficit. He exhibits normal muscle tone. Coordination normal.  Skin: Skin is warm and dry. No rash noted.  Psychiatric: He has a normal mood and  affect. His behavior is normal. Judgment and thought content normal.          Assessment & Plan:

## 2013-02-10 NOTE — Assessment & Plan Note (Signed)
Depo 80 mg IM Advair bid Zpac if worse Prom-cod

## 2013-08-03 ENCOUNTER — Other Ambulatory Visit: Payer: Self-pay | Admitting: Internal Medicine

## 2013-08-03 DIAGNOSIS — Z0000 Encounter for general adult medical examination without abnormal findings: Secondary | ICD-10-CM

## 2013-08-12 ENCOUNTER — Other Ambulatory Visit: Payer: Self-pay | Admitting: Internal Medicine

## 2013-08-31 ENCOUNTER — Other Ambulatory Visit (INDEPENDENT_AMBULATORY_CARE_PROVIDER_SITE_OTHER): Payer: BC Managed Care – PPO

## 2013-08-31 DIAGNOSIS — Z0000 Encounter for general adult medical examination without abnormal findings: Secondary | ICD-10-CM

## 2013-08-31 LAB — COMPREHENSIVE METABOLIC PANEL
ALT: 46 U/L (ref 0–53)
AST: 24 U/L (ref 0–37)
Albumin: 4.4 g/dL (ref 3.5–5.2)
Alkaline Phosphatase: 75 U/L (ref 39–117)
Calcium: 9.5 mg/dL (ref 8.4–10.5)
Chloride: 102 mEq/L (ref 96–112)
Potassium: 3.7 mEq/L (ref 3.5–5.1)
Sodium: 136 mEq/L (ref 135–145)
Total Protein: 7.3 g/dL (ref 6.0–8.3)

## 2013-08-31 LAB — URINALYSIS, ROUTINE W REFLEX MICROSCOPIC
Leukocytes, UA: NEGATIVE
Specific Gravity, Urine: 1.025 (ref 1.000–1.030)
Urine Glucose: NEGATIVE
Urobilinogen, UA: 0.2 (ref 0.0–1.0)
pH: 6.5 (ref 5.0–8.0)

## 2013-08-31 LAB — CBC WITH DIFFERENTIAL/PLATELET
Basophils Absolute: 0 10*3/uL (ref 0.0–0.1)
Basophils Relative: 0.5 % (ref 0.0–3.0)
Eosinophils Absolute: 0.1 10*3/uL (ref 0.0–0.7)
Lymphocytes Relative: 27.3 % (ref 12.0–46.0)
MCHC: 35.5 g/dL (ref 30.0–36.0)
MCV: 83.3 fl (ref 78.0–100.0)
Monocytes Absolute: 0.3 10*3/uL (ref 0.1–1.0)
Neutrophils Relative %: 65.5 % (ref 43.0–77.0)
Platelets: 244 10*3/uL (ref 150.0–400.0)
RBC: 5.65 Mil/uL (ref 4.22–5.81)
RDW: 12.4 % (ref 11.5–14.6)

## 2013-08-31 LAB — LDL CHOLESTEROL, DIRECT: Direct LDL: 138.3 mg/dL

## 2013-08-31 LAB — LIPID PANEL: Total CHOL/HDL Ratio: 6

## 2013-09-02 ENCOUNTER — Encounter: Payer: BC Managed Care – PPO | Admitting: Internal Medicine

## 2013-09-07 ENCOUNTER — Encounter: Payer: Self-pay | Admitting: Internal Medicine

## 2013-09-07 ENCOUNTER — Ambulatory Visit (INDEPENDENT_AMBULATORY_CARE_PROVIDER_SITE_OTHER): Payer: BC Managed Care – PPO | Admitting: Internal Medicine

## 2013-09-07 VITALS — BP 110/78 | HR 68 | Temp 97.5°F | Resp 12 | Ht 64.0 in | Wt 176.0 lb

## 2013-09-07 DIAGNOSIS — D485 Neoplasm of uncertain behavior of skin: Secondary | ICD-10-CM

## 2013-09-07 DIAGNOSIS — Z0100 Encounter for examination of eyes and vision without abnormal findings: Secondary | ICD-10-CM

## 2013-09-07 DIAGNOSIS — Z0000 Encounter for general adult medical examination without abnormal findings: Secondary | ICD-10-CM

## 2013-09-07 DIAGNOSIS — E785 Hyperlipidemia, unspecified: Secondary | ICD-10-CM

## 2013-09-07 MED ORDER — OMEGA-3-ACID ETHYL ESTERS 1 G PO CAPS: ORAL_CAPSULE | ORAL | Status: DC

## 2013-09-07 NOTE — Progress Notes (Signed)
   Subjective:    HPI  The patient presents for a follow-up of  chronic fatty liver, chronic dyslipidemia, GERD  F/u a spot on L shin x 3 years or so  BP Readings from Last 3 Encounters:  09/07/13 110/78  02/10/13 130/90  09/01/12 118/80   Wt Readings from Last 3 Encounters:  09/07/13 176 lb (79.833 kg)  02/10/13 176 lb (79.833 kg)  09/01/12 178 lb (80.74 kg)      Review of Systems  Constitutional: Negative for appetite change, fatigue and unexpected weight change.  HENT: Negative for nosebleeds, congestion, sore throat, sneezing, trouble swallowing and neck pain.   Eyes: Negative for itching and visual disturbance.  Respiratory: Negative for cough.   Cardiovascular: Negative for chest pain, palpitations and leg swelling.  Gastrointestinal: Negative for nausea, diarrhea, blood in stool and abdominal distention.  Genitourinary: Negative for frequency and hematuria.  Musculoskeletal: Negative for back pain, joint swelling and gait problem.  Skin: Negative for rash.  Neurological: Negative for dizziness, tremors, speech difficulty and weakness.  Psychiatric/Behavioral: Negative for sleep disturbance, dysphoric mood and agitation. The patient is not nervous/anxious.        Objective:   Physical Exam  Constitutional: He is oriented to person, place, and time. He appears well-developed.  HENT:  Mouth/Throat: Oropharynx is clear and moist.  Eyes: Conjunctivae are normal. Pupils are equal, round, and reactive to light.  Neck: Normal range of motion. No JVD present. No thyromegaly present.  Cardiovascular: Normal rate, regular rhythm, normal heart sounds and intact distal pulses.  Exam reveals no gallop and no friction rub.   No murmur heard. Pulmonary/Chest: Effort normal and breath sounds normal. No respiratory distress. He has no wheezes. He has no rales. He exhibits no tenderness.  Abdominal: Soft. Bowel sounds are normal. He exhibits no distension and no mass. There is no  tenderness. There is no rebound and no guarding.  Musculoskeletal: Normal range of motion. He exhibits no edema and no tenderness.  Lymphadenopathy:    He has no cervical adenopathy.  Neurological: He is alert and oriented to person, place, and time. He has normal reflexes. No cranial nerve deficit. He exhibits normal muscle tone. Coordination normal.  Skin: Skin is warm and dry. No rash noted.  L shin lesion, flat  Psychiatric: He has a normal mood and affect. His behavior is normal. Judgment and thought content normal.     7x8 mm L dist shin  Lab Results  Component Value Date   WBC 6.2 08/31/2013   HGB 16.7 08/31/2013   HCT 47.0 08/31/2013   PLT 244.0 08/31/2013   GLUCOSE 89 08/31/2013   CHOL 204* 08/31/2013   TRIG 162.0* 08/31/2013   HDL 36.70* 08/31/2013   LDLDIRECT 138.3 08/31/2013   LDLCALC 111* 07/31/2011   ALT 46 08/31/2013   AST 24 08/31/2013   NA 136 08/31/2013   K 3.7 08/31/2013   CL 102 08/31/2013   CREATININE 1.0 08/31/2013   BUN 11 08/31/2013   CO2 27 08/31/2013   TSH 1.48 08/31/2013   PSA 1.28 08/31/2013       Assessment & Plan:

## 2013-09-07 NOTE — Assessment & Plan Note (Signed)
We discussed age appropriate health related issues, including available/recomended screening tests and vaccinations. We discussed a need for adhering to healthy diet and exercise. Labs/EKG were reviewed/ordered. All questions were answered.  Eye exam  Pt declined a flu shot

## 2013-09-07 NOTE — Assessment & Plan Note (Signed)
Continue with current prescription therapy as reflected on the Med list.  

## 2013-09-07 NOTE — Assessment & Plan Note (Signed)
2014 L shin - dist -  7x8 mm Skin bx if needed

## 2013-09-29 ENCOUNTER — Other Ambulatory Visit: Payer: Self-pay | Admitting: *Deleted

## 2013-09-29 MED ORDER — OMEGA-3-ACID ETHYL ESTERS 1 G PO CAPS: ORAL_CAPSULE | ORAL | Status: DC

## 2013-12-18 ENCOUNTER — Telehealth: Payer: Self-pay | Admitting: *Deleted

## 2013-12-18 NOTE — Telephone Encounter (Signed)
Lovaza 1 Gm PA is approved 11/18/13 until 12/17/2016. Pt can only fill for 30 days at a time at local pharmacy. Pt and MD will receive approval letter.

## 2014-06-19 ENCOUNTER — Other Ambulatory Visit: Payer: Self-pay | Admitting: Internal Medicine

## 2014-06-30 ENCOUNTER — Encounter: Payer: Self-pay | Admitting: Internal Medicine

## 2014-06-30 ENCOUNTER — Ambulatory Visit (INDEPENDENT_AMBULATORY_CARE_PROVIDER_SITE_OTHER): Payer: BC Managed Care – PPO | Admitting: Internal Medicine

## 2014-06-30 VITALS — BP 120/90 | HR 76 | Temp 98.8°F | Resp 16 | Wt 174.0 lb

## 2014-06-30 DIAGNOSIS — J45901 Unspecified asthma with (acute) exacerbation: Secondary | ICD-10-CM

## 2014-06-30 DIAGNOSIS — J309 Allergic rhinitis, unspecified: Secondary | ICD-10-CM

## 2014-06-30 DIAGNOSIS — R059 Cough, unspecified: Secondary | ICD-10-CM | POA: Insufficient documentation

## 2014-06-30 DIAGNOSIS — R05 Cough: Secondary | ICD-10-CM

## 2014-06-30 MED ORDER — PROMETHAZINE-CODEINE 6.25-10 MG/5ML PO SYRP: 5.0000 mL | ORAL_SOLUTION | ORAL | Status: DC | PRN

## 2014-06-30 MED ORDER — FLUTICASONE-SALMETEROL 100-50 MCG/DOSE IN AEPB: 1.0000 | INHALATION_SPRAY | Freq: Two times a day (BID) | RESPIRATORY_TRACT | Status: DC

## 2014-06-30 MED ORDER — OMEPRAZOLE MAGNESIUM 20 MG PO TBEC: 20.0000 mg | DELAYED_RELEASE_TABLET | Freq: Every day | ORAL | Status: DC

## 2014-06-30 MED ORDER — LORATADINE 10 MG PO TABS: 10.0000 mg | ORAL_TABLET | Freq: Every day | ORAL | Status: DC

## 2014-06-30 NOTE — Progress Notes (Signed)
   Subjective:    Cough This is a new problem. The current episode started 1 to 4 weeks ago. The problem has been unchanged. The problem occurs every few minutes. The cough is non-productive. Pertinent negatives include no chest pain, chills or wheezing. He has tried cool air and OTC cough suppressant for the symptoms. The treatment provided no relief. His past medical history is significant for asthma.      Review of Systems  Constitutional: Negative for chills, diaphoresis and fatigue.  Respiratory: Positive for cough. Negative for wheezing and stridor.   Cardiovascular: Negative for chest pain.  Genitourinary: Negative for urgency.  Neurological: Negative for dizziness.       Objective:   Physical Exam  Constitutional: He is oriented to person, place, and time. He appears well-developed.  HENT:  Mouth/Throat: Oropharynx is clear and moist.  Eyes: Conjunctivae are normal. Pupils are equal, round, and reactive to light.  Neck: Normal range of motion. No JVD present. No thyromegaly present.  Cardiovascular: Normal rate, regular rhythm, normal heart sounds and intact distal pulses.  Exam reveals no gallop and no friction rub.   No murmur heard. Pulmonary/Chest: Effort normal and breath sounds normal. No respiratory distress. He has no wheezes. He has no rales. He exhibits no tenderness.  Abdominal: Soft. Bowel sounds are normal. He exhibits no distension and no mass. There is no tenderness. There is no rebound and no guarding.  Musculoskeletal: Normal range of motion. He exhibits no edema and no tenderness.  Lymphadenopathy:    He has no cervical adenopathy.  Neurological: He is alert and oriented to person, place, and time. He has normal reflexes. No cranial nerve deficit. He exhibits normal muscle tone. Coordination normal.  Skin: Skin is warm and dry. No rash noted.  Psychiatric: He has a normal mood and affect. His behavior is normal. Judgment and thought content normal.           Assessment & Plan:

## 2014-06-30 NOTE — Progress Notes (Signed)
Pre visit review using our clinic review tool, if applicable. No additional management support is needed unless otherwise documented below in the visit note. 

## 2014-06-30 NOTE — Assessment & Plan Note (Signed)
Loratidine 10 mg qd

## 2014-06-30 NOTE — Assessment & Plan Note (Signed)
Re-start Advair Prophylactic Prilosec and Claritin OTC qd

## 2014-06-30 NOTE — Assessment & Plan Note (Addendum)
Prom-cod syr CXR if not better Treat asthma

## 2014-09-10 ENCOUNTER — Other Ambulatory Visit (INDEPENDENT_AMBULATORY_CARE_PROVIDER_SITE_OTHER): Payer: BC Managed Care – PPO

## 2014-09-10 ENCOUNTER — Ambulatory Visit (INDEPENDENT_AMBULATORY_CARE_PROVIDER_SITE_OTHER): Payer: BC Managed Care – PPO | Admitting: Internal Medicine

## 2014-09-10 ENCOUNTER — Encounter: Payer: Self-pay | Admitting: Internal Medicine

## 2014-09-10 VITALS — BP 124/98 | HR 77 | Temp 98.2°F | Ht 63.0 in | Wt 172.0 lb

## 2014-09-10 DIAGNOSIS — E785 Hyperlipidemia, unspecified: Secondary | ICD-10-CM

## 2014-09-10 DIAGNOSIS — D485 Neoplasm of uncertain behavior of skin: Secondary | ICD-10-CM

## 2014-09-10 DIAGNOSIS — Z0000 Encounter for general adult medical examination without abnormal findings: Secondary | ICD-10-CM

## 2014-09-10 LAB — HEPATIC FUNCTION PANEL
ALT: 43 U/L (ref 0–53)
AST: 26 U/L (ref 0–37)
Albumin: 4.6 g/dL (ref 3.5–5.2)
Alkaline Phosphatase: 78 U/L (ref 39–117)
Bilirubin, Direct: 0.1 mg/dL (ref 0.0–0.3)
Total Bilirubin: 1 mg/dL (ref 0.2–1.2)
Total Protein: 7.8 g/dL (ref 6.0–8.3)

## 2014-09-10 LAB — CBC WITH DIFFERENTIAL/PLATELET
Basophils Absolute: 0 10*3/uL (ref 0.0–0.1)
Basophils Relative: 0.5 % (ref 0.0–3.0)
EOS ABS: 0.2 10*3/uL (ref 0.0–0.7)
Eosinophils Relative: 3 % (ref 0.0–5.0)
HCT: 47.9 % (ref 39.0–52.0)
HEMOGLOBIN: 16.9 g/dL (ref 13.0–17.0)
LYMPHS PCT: 34.6 % (ref 12.0–46.0)
Lymphs Abs: 2.4 10*3/uL (ref 0.7–4.0)
MCHC: 35.3 g/dL (ref 30.0–36.0)
MCV: 84.7 fl (ref 78.0–100.0)
MONOS PCT: 6.8 % (ref 3.0–12.0)
Monocytes Absolute: 0.5 10*3/uL (ref 0.1–1.0)
NEUTROS ABS: 3.8 10*3/uL (ref 1.4–7.7)
NEUTROS PCT: 55.1 % (ref 43.0–77.0)
Platelets: 253 10*3/uL (ref 150.0–400.0)
RBC: 5.66 Mil/uL (ref 4.22–5.81)
RDW: 13 % (ref 11.5–15.5)
WBC: 7 10*3/uL (ref 4.0–10.5)

## 2014-09-10 LAB — URINALYSIS
Bilirubin Urine: NEGATIVE
HGB URINE DIPSTICK: NEGATIVE
Ketones, ur: NEGATIVE
Leukocytes, UA: NEGATIVE
NITRITE: NEGATIVE
Specific Gravity, Urine: 1.015 (ref 1.000–1.030)
Total Protein, Urine: NEGATIVE
Urine Glucose: NEGATIVE
Urobilinogen, UA: 0.2 (ref 0.0–1.0)
pH: 6 (ref 5.0–8.0)

## 2014-09-10 LAB — LIPID PANEL
CHOL/HDL RATIO: 6
CHOLESTEROL: 208 mg/dL — AB (ref 0–200)
HDL: 36.5 mg/dL — ABNORMAL LOW (ref >39.00)
LDL CALC: 137 mg/dL — AB (ref 0–99)
NonHDL: 171.5
TRIGLYCERIDES: 173 mg/dL — AB (ref 0.0–149.0)
VLDL: 34.6 mg/dL (ref 0.0–40.0)

## 2014-09-10 LAB — BASIC METABOLIC PANEL
BUN: 12 mg/dL (ref 6–23)
CHLORIDE: 104 meq/L (ref 96–112)
CO2: 24 meq/L (ref 19–32)
CREATININE: 1 mg/dL (ref 0.4–1.5)
Calcium: 9.7 mg/dL (ref 8.4–10.5)
GFR: 84.25 mL/min (ref >60.00)
Glucose, Bld: 87 mg/dL (ref 70–99)
POTASSIUM: 3.8 meq/L (ref 3.5–5.1)
Sodium: 137 mEq/L (ref 135–145)

## 2014-09-10 LAB — URIC ACID: Uric Acid, Serum: 7.4 mg/dL (ref 4.0–7.8)

## 2014-09-10 LAB — TSH: TSH: 2.21 u[IU]/mL (ref 0.35–4.50)

## 2014-09-10 NOTE — Assessment & Plan Note (Signed)
Skin bx 

## 2014-09-10 NOTE — Progress Notes (Signed)
   Subjective:    HPI The patient is here for a wellness exam. The patient has been doing well overall without major physical or psychological issues going on lately.  The patient presents for a follow-up of  chronic fatty liver, chronic dyslipidemia, GERD    BP Readings from Last 3 Encounters:  09/10/14 124/98  06/30/14 120/90  09/07/13 110/78   Wt Readings from Last 3 Encounters:  09/10/14 172 lb (78.019 kg)  06/30/14 174 lb (78.926 kg)  09/07/13 176 lb (79.833 kg)      Review of Systems  Constitutional: Negative for appetite change, fatigue and unexpected weight change.  HENT: Negative for congestion, nosebleeds, sneezing, sore throat and trouble swallowing.   Eyes: Negative for itching and visual disturbance.  Respiratory: Negative for cough.   Cardiovascular: Negative for chest pain, palpitations and leg swelling.  Gastrointestinal: Negative for nausea, diarrhea, blood in stool and abdominal distention.  Genitourinary: Negative for frequency and hematuria.  Musculoskeletal: Negative for back pain, gait problem, joint swelling and neck pain.  Skin: Negative for rash.  Neurological: Negative for dizziness, tremors, speech difficulty and weakness.  Psychiatric/Behavioral: Negative for sleep disturbance, dysphoric mood and agitation. The patient is not nervous/anxious.        Objective:   Physical Exam  Constitutional: He is oriented to person, place, and time. He appears well-developed.  HENT:  Mouth/Throat: Oropharynx is clear and moist.  Eyes: Conjunctivae are normal. Pupils are equal, round, and reactive to light.  Neck: Normal range of motion. No JVD present. No thyromegaly present.  Cardiovascular: Normal rate, regular rhythm, normal heart sounds and intact distal pulses.  Exam reveals no gallop and no friction rub.   No murmur heard. Pulmonary/Chest: Effort normal and breath sounds normal. No respiratory distress. He has no wheezes. He has no rales. He exhibits  no tenderness.  Abdominal: Soft. Bowel sounds are normal. He exhibits no distension and no mass. There is no tenderness. There is no rebound and no guarding.  Musculoskeletal: Normal range of motion. He exhibits no edema and no tenderness.  Lymphadenopathy:    He has no cervical adenopathy.  Neurological: He is alert and oriented to person, place, and time. He has normal reflexes. No cranial nerve deficit. He exhibits normal muscle tone. Coordination normal.  Skin: Skin is warm and dry. No rash noted.  L shin lesion, flat  Psychiatric: He has a normal mood and affect. His behavior is normal. Judgment and thought content normal.     Lab Results  Component Value Date   WBC 6.2 08/31/2013   HGB 16.7 08/31/2013   HCT 47.0 08/31/2013   PLT 244.0 08/31/2013   GLUCOSE 89 08/31/2013   CHOL 204* 08/31/2013   TRIG 162.0* 08/31/2013   HDL 36.70* 08/31/2013   LDLDIRECT 138.3 08/31/2013   LDLCALC 111* 07/31/2011   ALT 46 08/31/2013   AST 24 08/31/2013   NA 136 08/31/2013   K 3.7 08/31/2013   CL 102 08/31/2013   CREATININE 1.0 08/31/2013   BUN 11 08/31/2013   CO2 27 08/31/2013   TSH 1.48 08/31/2013   PSA 1.28 08/31/2013       Assessment & Plan:

## 2014-09-10 NOTE — Assessment & Plan Note (Signed)
We discussed age appropriate health related issues, including available/recomended screening tests and vaccinations. We discussed a need for adhering to healthy diet and exercise. Labs/EKG were reviewed/ordered. All questions were answered. Continue with current prescription therapy as reflected on the Med list.

## 2014-09-10 NOTE — Assessment & Plan Note (Signed)
Declined a statin Chronic  Labs

## 2014-09-10 NOTE — Progress Notes (Deleted)
Pre visit review using our clinic review tool, if applicable. No additional management support is needed unless otherwise documented below in the visit note. 

## 2014-09-29 ENCOUNTER — Encounter: Payer: Self-pay | Admitting: Internal Medicine

## 2014-09-29 ENCOUNTER — Ambulatory Visit (INDEPENDENT_AMBULATORY_CARE_PROVIDER_SITE_OTHER): Payer: BC Managed Care – PPO | Admitting: Internal Medicine

## 2014-09-29 VITALS — BP 120/86 | HR 82 | Temp 98.3°F | Wt 173.0 lb

## 2014-09-29 DIAGNOSIS — L570 Actinic keratosis: Secondary | ICD-10-CM

## 2014-09-29 DIAGNOSIS — D485 Neoplasm of uncertain behavior of skin: Secondary | ICD-10-CM

## 2014-09-29 DIAGNOSIS — D489 Neoplasm of uncertain behavior, unspecified: Secondary | ICD-10-CM

## 2014-09-29 NOTE — Progress Notes (Signed)
Pre visit review using our clinic review tool, if applicable. No additional management support is needed unless otherwise documented below in the visit note. 

## 2014-09-29 NOTE — Progress Notes (Signed)
Skin bx R shin 11x8 mm lesion AK on L leg   Procedure Note :     Procedure :  Skin biopsy   Indication: Suspicious lesion(s)   Risks including unsuccessful procedure , bleeding, infection, bruising, scar, a need for another complete procedure and others were explained to the patient in detail as well as the benefits. Informed consent was obtained and signed.   The patient was placed in a decubitus position.  Lesion #1 on R shin    measuring 11x8    mm   Skin over lesion #1  was prepped with Betadine and alcohol  and anesthetized with 1 cc of 2% lidocaine and epinephrine, using a 25-gauge 1 inch needle.  Shave biopsy with a sterile Dermablade was carried out in the usual fashion. Hyfrecator was used to destroy the rest of the lesion potentially left behind and for hemostasis. Band-Aid was applied with antibiotic ointment.   Procedure Note :     Procedure : Cryosurgery   Indication:   Actinic keratosis(es)   Risks including unsuccessful procedure , bleeding, infection, bruising, scar, a need for a repeat  procedure and others were explained to the patient in detail as well as the benefits. Informed consent was obtained verbally.   1  lesion(s)  on L leg   was/were treated with liquid nitrogen on a Q-tip in a usual fasion . Band-Aid was applied and antibiotic ointment was given for a later use.   Tolerated well. Complications none.

## 2014-10-10 DIAGNOSIS — L570 Actinic keratosis: Secondary | ICD-10-CM | POA: Insufficient documentation

## 2014-10-10 NOTE — Assessment & Plan Note (Signed)
Skin bx 

## 2014-10-10 NOTE — Patient Instructions (Signed)
Postprocedure instructions :    A Band-Aid should be  changed twice daily. You can take a shower tomorrow.  Keep the wounds clean. You can wash them with liquid soap and water. Pat dry with gauze or a Kleenex tissue  Before applying antibiotic ointment and a Band-Aid.   You need to report immediately  if fever, chills or any signs of infection develop.    The biopsy results should be available in 1 -2 weeks. 

## 2014-10-10 NOTE — Assessment & Plan Note (Signed)
Cryo  

## 2014-12-22 ENCOUNTER — Telehealth: Payer: Self-pay | Admitting: Internal Medicine

## 2014-12-22 MED ORDER — OMEGA-3-ACID ETHYL ESTERS 1 G PO CAPS: ORAL_CAPSULE | ORAL | Status: DC

## 2014-12-22 NOTE — Telephone Encounter (Signed)
Pt came by office to request Rx refill for LOVAZA 120 capsules. Pt states he would like Rx [facility] to [facility] on [address]. Please contact pt when request is reviewed.

## 2014-12-22 NOTE — Telephone Encounter (Signed)
Done. Pt informed.

## 2015-01-21 ENCOUNTER — Other Ambulatory Visit: Payer: Self-pay | Admitting: Internal Medicine

## 2015-01-21 ENCOUNTER — Ambulatory Visit (INDEPENDENT_AMBULATORY_CARE_PROVIDER_SITE_OTHER): Payer: BLUE CROSS/BLUE SHIELD | Admitting: Internal Medicine

## 2015-01-21 ENCOUNTER — Encounter: Payer: Self-pay | Admitting: Internal Medicine

## 2015-01-21 VITALS — BP 110/80 | HR 87 | Temp 98.8°F | Wt 176.0 lb

## 2015-01-21 DIAGNOSIS — D485 Neoplasm of uncertain behavior of skin: Secondary | ICD-10-CM

## 2015-01-21 NOTE — Progress Notes (Signed)
Pre visit review using our clinic review tool, if applicable. No additional management support is needed unless otherwise documented below in the visit note. 

## 2015-01-21 NOTE — Patient Instructions (Signed)
Postprocedure instructions :    A Band-Aid should be  changed twice daily. You can take a shower tomorrow.  Keep the wounds clean. You can wash them with liquid soap and water. Pat dry with gauze or a Kleenex tissue  Before applying antibiotic ointment and a Band-Aid.   You need to report immediately  if fever, chills or any signs of infection develop.    The biopsy results should be available in 1 -2 weeks. 

## 2015-01-21 NOTE — Progress Notes (Signed)
   Subjective:    HPI C/o growth on L thigh   BP Readings from Last 3 Encounters:  01/21/15 110/80  09/29/14 120/86  09/10/14 124/98   Wt Readings from Last 3 Encounters:  01/21/15 176 lb (79.833 kg)  09/29/14 173 lb (78.472 kg)  09/10/14 172 lb (78.019 kg)      Review of Systems  Constitutional: Negative for appetite change, fatigue and unexpected weight change.  HENT: Negative for congestion, nosebleeds, sneezing, sore throat and trouble swallowing.   Eyes: Negative for itching and visual disturbance.  Respiratory: Negative for cough.   Cardiovascular: Negative for chest pain, palpitations and leg swelling.  Gastrointestinal: Negative for nausea, diarrhea, blood in stool and abdominal distention.  Genitourinary: Negative for frequency and hematuria.  Musculoskeletal: Negative for back pain, joint swelling, gait problem and neck pain.  Skin: Negative for rash.  Neurological: Negative for dizziness, tremors, speech difficulty and weakness.  Psychiatric/Behavioral: Negative for sleep disturbance, dysphoric mood and agitation. The patient is not nervous/anxious.        Objective:   Physical Exam  Constitutional: He is oriented to person, place, and time. He appears well-developed.  HENT:  Mouth/Throat: Oropharynx is clear and moist.  Eyes: Conjunctivae are normal. Pupils are equal, round, and reactive to light.  Neck: Normal range of motion.  Cardiovascular: Normal rate, regular rhythm, normal heart sounds and intact distal pulses.   Pulmonary/Chest: Effort normal and breath sounds normal.  Abdominal: Soft. Bowel sounds are normal.  Musculoskeletal: Normal range of motion.  Neurological: He is alert and oriented to person, place, and time. He has normal reflexes. He exhibits normal muscle tone.  Skin: Skin is warm and dry.  Psychiatric: He has a normal mood and affect. His behavior is normal. Judgment and thought content normal.   L lat dist thigh 7x7 mm nodular  growth L shin flat scar   Procedure Note :     Procedure :  Skin biopsy   Indication:   Suspicious lesion(s)   Risks including unsuccessful procedure , bleeding, infection, bruising, scar, a need for another complete procedure and others were explained to the patient in detail as well as the benefits. Informed consent was obtained and signed.   The patient was placed in a decubitus position.  Lesion #1 on  L lat dist thigh 7x7 mm nodular growth      Skin over lesion #1  was prepped with Betadine and alcohol  and anesthetized with 1 cc of 2% lidocaine and epinephrine, using a 25-gauge 1 inch needle.  Shave biopsy with a sterile Dermablade was carried out in the usual fashion. Hyfrecator was used to destroy the rest of the lesion potentially left behind and for hemostasis. Band-Aid was applied with antibiotic ointment.   Tolerated well. Complications none.      Postprocedure instructions :    A Band-Aid should be  changed twice daily. You can take a shower tomorrow.  Keep the wounds clean. You can wash them with liquid soap and water. Pat dry with gauze or a Kleenex tissue  Before applying antibiotic ointment and a Band-Aid.   You need to report immediately  if fever, chills or any signs of infection develop.    The biopsy results should be available in 1 -2 weeks.       Assessment & Plan:

## 2015-01-21 NOTE — Assessment & Plan Note (Signed)
Skin bx 

## 2015-09-12 ENCOUNTER — Encounter: Payer: BC Managed Care – PPO | Admitting: Internal Medicine

## 2015-09-13 ENCOUNTER — Encounter: Payer: Self-pay | Admitting: Internal Medicine

## 2015-09-13 ENCOUNTER — Ambulatory Visit (INDEPENDENT_AMBULATORY_CARE_PROVIDER_SITE_OTHER): Payer: BLUE CROSS/BLUE SHIELD | Admitting: Internal Medicine

## 2015-09-13 ENCOUNTER — Other Ambulatory Visit (INDEPENDENT_AMBULATORY_CARE_PROVIDER_SITE_OTHER): Payer: BLUE CROSS/BLUE SHIELD

## 2015-09-13 VITALS — BP 120/82 | HR 77 | Ht 63.0 in | Wt 173.0 lb

## 2015-09-13 DIAGNOSIS — Z0000 Encounter for general adult medical examination without abnormal findings: Secondary | ICD-10-CM

## 2015-09-13 LAB — BASIC METABOLIC PANEL
BUN: 9 mg/dL (ref 6–23)
CO2: 28 mEq/L (ref 19–32)
Calcium: 9.8 mg/dL (ref 8.4–10.5)
Chloride: 103 mEq/L (ref 96–112)
Creatinine, Ser: 0.97 mg/dL (ref 0.40–1.50)
GFR: 89.86 mL/min (ref >60.00)
Glucose, Bld: 91 mg/dL (ref 70–99)
POTASSIUM: 4 meq/L (ref 3.5–5.1)
SODIUM: 139 meq/L (ref 135–145)

## 2015-09-13 LAB — HEPATIC FUNCTION PANEL
ALBUMIN: 4.6 g/dL (ref 3.5–5.2)
ALT: 47 U/L (ref 0–53)
AST: 23 U/L (ref 0–37)
Alkaline Phosphatase: 87 U/L (ref 39–117)
Bilirubin, Direct: 0.1 mg/dL (ref 0.0–0.3)
TOTAL PROTEIN: 7.1 g/dL (ref 6.0–8.3)
Total Bilirubin: 1 mg/dL (ref 0.2–1.2)

## 2015-09-13 LAB — URINALYSIS
BILIRUBIN URINE: NEGATIVE
HGB URINE DIPSTICK: NEGATIVE
KETONES UR: NEGATIVE
LEUKOCYTES UA: NEGATIVE
NITRITE: NEGATIVE
PH: 7 (ref 5.0–8.0)
Specific Gravity, Urine: 1.01 (ref 1.000–1.030)
Total Protein, Urine: NEGATIVE
UROBILINOGEN UA: 0.2 (ref 0.0–1.0)
Urine Glucose: NEGATIVE

## 2015-09-13 LAB — LIPID PANEL
CHOLESTEROL: 189 mg/dL (ref 0–200)
HDL: 34.5 mg/dL — ABNORMAL LOW (ref >39.00)
LDL CALC: 119 mg/dL — AB (ref 0–99)
NonHDL: 154.98
Total CHOL/HDL Ratio: 5
Triglycerides: 178 mg/dL — ABNORMAL HIGH (ref 0.0–149.0)
VLDL: 35.6 mg/dL (ref 0.0–40.0)

## 2015-09-13 LAB — CBC WITH DIFFERENTIAL/PLATELET
Basophils Absolute: 0 10*3/uL (ref 0.0–0.1)
Basophils Relative: 0.5 % (ref 0.0–3.0)
EOS PCT: 2.2 % (ref 0.0–5.0)
Eosinophils Absolute: 0.1 10*3/uL (ref 0.0–0.7)
HEMATOCRIT: 47 % (ref 39.0–52.0)
HEMOGLOBIN: 16.5 g/dL (ref 13.0–17.0)
LYMPHS PCT: 38.2 % (ref 12.0–46.0)
Lymphs Abs: 2.3 10*3/uL (ref 0.7–4.0)
MCHC: 35 g/dL (ref 30.0–36.0)
MCV: 84.1 fl (ref 78.0–100.0)
MONO ABS: 0.4 10*3/uL (ref 0.1–1.0)
MONOS PCT: 6.4 % (ref 3.0–12.0)
Neutro Abs: 3.1 10*3/uL (ref 1.4–7.7)
Neutrophils Relative %: 52.7 % (ref 43.0–77.0)
Platelets: 248 10*3/uL (ref 150.0–400.0)
RBC: 5.59 Mil/uL (ref 4.22–5.81)
RDW: 12.6 % (ref 11.5–15.5)
WBC: 6 10*3/uL (ref 4.0–10.5)

## 2015-09-13 LAB — PSA: PSA: 1.41 ng/mL (ref 0.10–4.00)

## 2015-09-13 LAB — TSH: TSH: 1.06 u[IU]/mL (ref 0.35–4.50)

## 2015-09-13 MED ORDER — OMEGA-3-ACID ETHYL ESTERS 1 G PO CAPS: ORAL_CAPSULE | ORAL | Status: DC

## 2015-09-13 NOTE — Assessment & Plan Note (Signed)
Doing well 

## 2015-09-13 NOTE — Progress Notes (Signed)
Pre visit review using our clinic review tool, if applicable. No additional management support is needed unless otherwise documented below in the visit note. 

## 2015-09-13 NOTE — Assessment & Plan Note (Signed)
We discussed age appropriate health related issues, including available/recomended screening tests and vaccinations. We discussed a need for adhering to healthy diet and exercise. Labs/EKG were reviewed/ordered. All questions were answered.   

## 2015-09-13 NOTE — Progress Notes (Signed)
Subjective:  Patient ID: [name], male    DOB: March 03, 1971  Age: 44 y.o. MRN: [id number]  CC: Annual Exam   HPI [name] presents for well exam.  Outpatient Prescriptions Prior to Visit  Medication Sig Dispense Refill  . cholecalciferol (VITAMIN D) 1000 UNITS tablet Take 1,000 Units by mouth daily.      Marland Kitchen omega-3 acid ethyl esters (LOVAZA) 1 G capsule TAKE 2 CAPSULES BY MOUTH TWICE A DAY 120 capsule 5  . loratadine (CLARITIN) 10 MG tablet Take 1 tablet (10 mg total) by mouth daily. (Patient not taking: Reported on 09/13/2015) 30 tablet 1   No facility-administered medications prior to visit.    ROS Review of Systems  Constitutional: Negative for appetite change, fatigue and unexpected weight change.  HENT: Negative for congestion, nosebleeds, sneezing, sore throat and trouble swallowing.   Eyes: Negative for itching and visual disturbance.  Respiratory: Negative for cough.   Cardiovascular: Negative for chest pain, palpitations and leg swelling.  Gastrointestinal: Negative for nausea, diarrhea, blood in stool and abdominal distention.  Genitourinary: Negative for frequency and hematuria.  Musculoskeletal: Negative for back pain, joint swelling, gait problem and neck pain.  Skin: Negative for rash.  Neurological: Negative for dizziness, tremors, speech difficulty and weakness.  Psychiatric/Behavioral: Negative for suicidal ideas, sleep disturbance, dysphoric mood and agitation. The patient is not nervous/anxious.     Objective:  BP 120/82 mmHg  Pulse 77  Ht 5\' 3"  (1.6 m)  Wt 173 lb (78.472 kg)  BMI 30.65 kg/m2  SpO2 95%  BP Readings from Last 3 Encounters:  09/13/15 120/82  01/21/15 110/80  09/29/14 120/86    Wt Readings from Last 3 Encounters:  09/13/15 173 lb (78.472 kg)  01/21/15 176 lb (79.833 kg)  09/29/14 173 lb (78.472 kg)    Physical Exam  Constitutional: He is oriented to person, place, and time. He appears well-developed. No distress.  NAD    HENT:  Mouth/Throat: Oropharynx is clear and moist.  Eyes: Conjunctivae are normal. Pupils are equal, round, and reactive to light.  Neck: Normal range of motion. No JVD present. No thyromegaly present.  Cardiovascular: Normal rate, regular rhythm, normal heart sounds and intact distal pulses.  Exam reveals no gallop and no friction rub.   No murmur heard. Pulmonary/Chest: Effort normal and breath sounds normal. No respiratory distress. He has no wheezes. He has no rales. He exhibits no tenderness.  Abdominal: Soft. Bowel sounds are normal. He exhibits no distension and no mass. There is no tenderness. There is no rebound and no guarding.  Musculoskeletal: Normal range of motion. He exhibits no edema or tenderness.  Lymphadenopathy:    He has no cervical adenopathy.  Neurological: He is alert and oriented to person, place, and time. He has normal reflexes. No cranial nerve deficit. He exhibits normal muscle tone. He displays a negative Romberg sign. Coordination and gait normal.  Skin: Skin is warm and dry. No rash noted.  Psychiatric: He has a normal mood and affect. His behavior is normal. Judgment and thought content normal.  SKs testic self exam nl  Lab Results  Component Value Date   WBC 7.0 09/10/2014   HGB 16.9 09/10/2014   HCT 47.9 09/10/2014   PLT 253.0 09/10/2014   GLUCOSE 87 09/10/2014   CHOL 208* 09/10/2014   TRIG 173.0* 09/10/2014   HDL 36.50* 09/10/2014   LDLDIRECT 138.3 08/31/2013   LDLCALC 137* 09/10/2014   ALT 43 09/10/2014   AST 26 09/10/2014  NA 137 09/10/2014   K 3.8 09/10/2014   CL 104 09/10/2014   CREATININE 1.0 09/10/2014   BUN 12 09/10/2014   CO2 24 09/10/2014   TSH 2.21 09/10/2014   PSA 1.28 08/31/2013    No results found.  Assessment & Plan:   [name] was seen today for annual exam.  Diagnoses and all orders for this visit:  Well adult exam -     Hepatic function panel; Future -     Basic metabolic panel; Future -     CBC with  Differential/Platelet; Future -     Lipid panel; Future -     PSA; Future -     TSH; Future -     Urinalysis; Future  I am having [name] maintain his cholecalciferol, loratadine, and omega-3 acid ethyl esters.  No orders of the defined types were placed in this encounter.     Follow-up: Return in about 1 year (around 09/12/2016) for Wellness Exam.  Walker Kehr, MD

## 2016-01-02 ENCOUNTER — Telehealth: Payer: Self-pay | Admitting: Internal Medicine

## 2016-01-02 NOTE — Telephone Encounter (Signed)
Pt is wanting to know if he can get a script for Advair Diskus 100/50.  [facility] [address].  *Pt is not due for another OV until his CPE in Sept 2017.*  He stated you had given his a sample of this a few years ago.

## 2016-01-02 NOTE — Telephone Encounter (Signed)
OK to fill this Advair prescription with additional refills x11 Thank you!

## 2016-03-06 ENCOUNTER — Ambulatory Visit (INDEPENDENT_AMBULATORY_CARE_PROVIDER_SITE_OTHER): Payer: BLUE CROSS/BLUE SHIELD | Admitting: Internal Medicine

## 2016-03-06 ENCOUNTER — Encounter: Payer: Self-pay | Admitting: Internal Medicine

## 2016-03-06 ENCOUNTER — Other Ambulatory Visit (INDEPENDENT_AMBULATORY_CARE_PROVIDER_SITE_OTHER): Payer: BLUE CROSS/BLUE SHIELD

## 2016-03-06 VITALS — BP 140/80 | HR 73 | Temp 98.8°F | Wt 172.0 lb

## 2016-03-06 DIAGNOSIS — K921 Melena: Secondary | ICD-10-CM

## 2016-03-06 DIAGNOSIS — R030 Elevated blood-pressure reading, without diagnosis of hypertension: Secondary | ICD-10-CM | POA: Diagnosis not present

## 2016-03-06 DIAGNOSIS — IMO0001 Reserved for inherently not codable concepts without codable children: Secondary | ICD-10-CM

## 2016-03-06 LAB — CBC WITH DIFFERENTIAL/PLATELET
Basophils Absolute: 0 10*3/uL (ref 0.0–0.1)
Basophils Relative: 0.4 % (ref 0.0–3.0)
EOS PCT: 2.2 % (ref 0.0–5.0)
Eosinophils Absolute: 0.1 10*3/uL (ref 0.0–0.7)
HEMATOCRIT: 47.2 % (ref 39.0–52.0)
HEMOGLOBIN: 16.5 g/dL (ref 13.0–17.0)
LYMPHS PCT: 36.5 % (ref 12.0–46.0)
Lymphs Abs: 2.3 10*3/uL (ref 0.7–4.0)
MCHC: 35 g/dL (ref 30.0–36.0)
MCV: 83.2 fl (ref 78.0–100.0)
MONO ABS: 0.5 10*3/uL (ref 0.1–1.0)
Monocytes Relative: 7.5 % (ref 3.0–12.0)
Neutro Abs: 3.4 10*3/uL (ref 1.4–7.7)
Neutrophils Relative %: 53.4 % (ref 43.0–77.0)
Platelets: 288 10*3/uL (ref 150.0–400.0)
RBC: 5.68 Mil/uL (ref 4.22–5.81)
RDW: 13.1 % (ref 11.5–15.5)
WBC: 6.4 10*3/uL (ref 4.0–10.5)

## 2016-03-06 LAB — PROTIME-INR
INR: 1.1 ratio — ABNORMAL HIGH (ref 0.8–1.0)
PROTHROMBIN TIME: 11.6 s (ref 9.6–13.1)

## 2016-03-06 NOTE — Assessment & Plan Note (Addendum)
Colonoscopy scheduled ?etiology Labs

## 2016-03-06 NOTE — Progress Notes (Signed)
Subjective:  Patient ID: [name], male    DOB: 1971/09/11  Age: 45 y.o. MRN: [id number]  CC: No chief complaint on file.   HPI Cisco Mcguane presents for bllood in stool x1 last Thursday - drops of blood in the water  Outpatient Prescriptions Prior to Visit  Medication Sig Dispense Refill  . cholecalciferol (VITAMIN D) 1000 UNITS tablet Take 1,000 Units by mouth daily.      Marland Kitchen omega-3 acid ethyl esters (LOVAZA) 1 G capsule TAKE 2 CAPSULES BY MOUTH TWICE A DAY 360 capsule 3  . loratadine (CLARITIN) 10 MG tablet Take 1 tablet (10 mg total) by mouth daily. (Patient not taking: Reported on 03/06/2016) 30 tablet 1   No facility-administered medications prior to visit.    ROS Review of Systems  Constitutional: Negative for appetite change, fatigue and unexpected weight change.  HENT: Negative for congestion, nosebleeds, sneezing, sore throat and trouble swallowing.   Eyes: Negative for itching and visual disturbance.  Respiratory: Negative for cough.   Cardiovascular: Negative for chest pain, palpitations and leg swelling.  Gastrointestinal: Positive for anal bleeding. Negative for nausea, abdominal pain, diarrhea, constipation, blood in stool and abdominal distention.  Genitourinary: Negative for frequency and hematuria.  Musculoskeletal: Negative for back pain, joint swelling, gait problem and neck pain.  Skin: Negative for rash.  Neurological: Negative for dizziness, tremors, speech difficulty and weakness.  Psychiatric/Behavioral: Negative for sleep disturbance, dysphoric mood and agitation. The patient is not nervous/anxious.     Objective:  BP 140/80 mmHg  Pulse 73  Temp(Src) 98.8 F (37.1 C) (Oral)  Wt 172 lb (78.019 kg)  SpO2 96%  BP Readings from Last 3 Encounters:  03/06/16 140/80  09/13/15 120/82  01/21/15 110/80    Wt Readings from Last 3 Encounters:  03/06/16 172 lb (78.019 kg)  09/13/15 173 lb (78.472 kg)  01/21/15 176 lb (79.833 kg)    Physical Exam   Constitutional: He is oriented to person, place, and time. He appears well-developed. No distress.  NAD  HENT:  Mouth/Throat: Oropharynx is clear and moist.  Eyes: Conjunctivae are normal. Pupils are equal, round, and reactive to light.  Neck: Normal range of motion. No JVD present. No thyromegaly present.  Cardiovascular: Normal rate, regular rhythm, normal heart sounds and intact distal pulses.  Exam reveals no gallop and no friction rub.   No murmur heard. Pulmonary/Chest: Effort normal and breath sounds normal. No respiratory distress. He has no wheezes. He has no rales. He exhibits no tenderness.  Abdominal: Soft. Bowel sounds are normal. He exhibits no distension and no mass. There is no tenderness. There is no rebound and no guarding.  Musculoskeletal: Normal range of motion. He exhibits no edema or tenderness.  Lymphadenopathy:    He has no cervical adenopathy.  Neurological: He is alert and oriented to person, place, and time. He has normal reflexes. No cranial nerve deficit. He exhibits normal muscle tone. He displays a negative Romberg sign. Coordination and gait normal.  Skin: Skin is warm and dry. No rash noted.  Psychiatric: He has a normal mood and affect. His behavior is normal. Judgment and thought content normal.  Rectal - declined  Lab Results  Component Value Date   WBC 6.0 09/13/2015   HGB 16.5 09/13/2015   HCT 47.0 09/13/2015   PLT 248.0 09/13/2015   GLUCOSE 91 09/13/2015   CHOL 189 09/13/2015   TRIG 178.0* 09/13/2015   HDL 34.50* 09/13/2015   LDLDIRECT 138.3 08/31/2013   LDLCALC 119*  09/13/2015   ALT 47 09/13/2015   AST 23 09/13/2015   NA 139 09/13/2015   K 4.0 09/13/2015   CL 103 09/13/2015   CREATININE 0.97 09/13/2015   BUN 9 09/13/2015   CO2 28 09/13/2015   TSH 1.06 09/13/2015   PSA 1.41 09/13/2015    No results found.  Assessment & Plan:   Diagnoses and all orders for this visit:  Hematochezia -     Protime-INR; Future -     CBC with  Differential/Platelet; Future -     Ambulatory referral to Gastroenterology  Elevated BP   I am having Mr. Silmon maintain his cholecalciferol, loratadine, and omega-3 acid ethyl esters.  No orders of the defined types were placed in this encounter.     Follow-up: No Follow-up on file.  Walker Kehr, MD

## 2016-03-06 NOTE — Assessment & Plan Note (Signed)
Monitor at home BP Call if elevated

## 2016-03-06 NOTE — Progress Notes (Signed)
Pre visit review using our clinic review tool, if applicable. No additional management support is needed unless otherwise documented below in the visit note. 

## 2016-03-08 ENCOUNTER — Encounter: Payer: Self-pay | Admitting: Gastroenterology

## 2016-05-02 ENCOUNTER — Ambulatory Visit (INDEPENDENT_AMBULATORY_CARE_PROVIDER_SITE_OTHER): Payer: BLUE CROSS/BLUE SHIELD | Admitting: Gastroenterology

## 2016-05-02 ENCOUNTER — Encounter: Payer: Self-pay | Admitting: Gastroenterology

## 2016-05-02 VITALS — BP 106/70 | HR 62 | Ht 63.0 in | Wt 170.5 lb

## 2016-05-02 DIAGNOSIS — K625 Hemorrhage of anus and rectum: Secondary | ICD-10-CM

## 2016-05-02 MED ORDER — NA SULFATE-K SULFATE-MG SULF 17.5-3.13-1.6 GM/177ML PO SOLN: 1.0000 | Freq: Once | ORAL | Status: DC

## 2016-05-02 NOTE — Patient Instructions (Signed)
You will be set up for a colonoscopy for minor rectal bleeding. 

## 2016-05-02 NOTE — Progress Notes (Signed)
HPI: This is a    very pleasant 45 year old [place] born man    who was referred to me by Plotnikov, Evie Lacks, MD  to evaluate  minor rectal bleeding .    Chief complaint is minor rectal bleeding  2 months ago, 2-3 drops of blood per rectum.  No anal pain.  Not a particularly tough to move bowel movement.  No FH of colon cancer.  Overall weight down 2 pounds with effort.  He really has no significant difficulties moving his bowels generally. No constipation, no diarrhea  Review of systems: Pertinent positive and negative review of systems were noted in the above HPI section. Complete review of systems was performed and was otherwise normal.   Past Medical History  Diagnosis Date  . Allergy   . Hyperlipidemia   . GERD (gastroesophageal reflux disease)   . History of liver disease     Fatty Liver disease  . Asthmatic bronchitis     Due to Latex paint    Past Surgical History  Procedure Laterality Date  . Left  leg surgery    . Left hip surgery      Current Outpatient Prescriptions  Medication Sig Dispense Refill  . cholecalciferol (VITAMIN D) 1000 UNITS tablet Take 1,000 Units by mouth daily.      Marland Kitchen omega-3 acid ethyl esters (LOVAZA) 1 G capsule TAKE 2 CAPSULES BY MOUTH TWICE A DAY 360 capsule 3   No current facility-administered medications for this visit.    Allergies as of 05/02/2016  . (No Known Allergies)    Family History  Problem Relation Age of Onset  . Cancer Mother 29    cervical ca  . Glaucoma Father     Social History   Social History  . Marital Status: Married    Spouse Name: N/A  . Number of Children: N/A  . Years of Education: N/A   Occupational History  . Not on file.   Social History Main Topics  . Smoking status: Never Smoker   . Smokeless tobacco: Not on file  . Alcohol Use: No  . Drug Use: No  . Sexual Activity: Yes   Other Topics Concern  . Not on file   Social History Narrative     Physical Exam: BP 106/70 mmHg   Pulse 62  Ht 5\' 3"  (1.6 m)  Wt 170 lb 8 oz (77.338 kg)  BMI 30.21 kg/m2 Constitutional: generally well-appearing Psychiatric: alert and oriented x3 Eyes: extraocular movements intact Mouth: oral pharynx moist, no lesions Neck: supple no lymphadenopathy Cardiovascular: heart regular rate and rhythm Lungs: clear to auscultation bilaterally Abdomen: soft, nontender, nondistended, no obvious ascites, no peritoneal signs, normal bowel sounds Extremities: no lower extremity edema bilaterally Skin: no lesions on visible extremities Rectal exam was deferred for upcoming colonoscopy  Assessment and plan: 45 y.o. male with  minor rectal bleeding  I explained to him that this is very most likely nothing serious. Most likely is from benign causes such as hemorrhoids. That being said he is 45 years old and would like to rule out neoplastic causes. I recommended we proceed with colonoscopy at his soonest convenience. He understands the risks and benefits and agrees to proceed. I see no reason for any further blood tests or imaging studies prior to then   Owens Loffler, MD [facility]enter[facility] 05/02/2016, 10:04 AM  Cc: Cassandria Anger, MD

## 2016-05-21 ENCOUNTER — Encounter: Payer: Self-pay | Admitting: Gastroenterology

## 2016-05-30 ENCOUNTER — Ambulatory Visit (AMBULATORY_SURGERY_CENTER): Payer: BLUE CROSS/BLUE SHIELD | Admitting: Gastroenterology

## 2016-05-30 ENCOUNTER — Encounter: Payer: Self-pay | Admitting: Gastroenterology

## 2016-05-30 VITALS — BP 113/80 | HR 70 | Temp 98.6°F | Resp 19 | Ht 63.0 in | Wt 170.0 lb

## 2016-05-30 DIAGNOSIS — K625 Hemorrhage of anus and rectum: Secondary | ICD-10-CM

## 2016-05-30 MED ORDER — SODIUM CHLORIDE 0.9 % IV SOLN: 500.0000 mL | INTRAVENOUS | Status: DC

## 2016-05-30 NOTE — Progress Notes (Signed)
To pacu vss patent aw reprot to rn 

## 2016-05-30 NOTE — Patient Instructions (Signed)
YOU HAD AN ENDOSCOPIC PROCEDURE TODAY AT THE [facility]:   Refer to the procedure report that was given to you for any specific questions about what was found during the examination.  If the procedure report does not answer your questions, please call your gastroenterologist to clarify.  If you requested that your care partner not be given the details of your procedure findings, then the procedure report has been included in a sealed envelope for you to review at your convenience later.  YOU SHOULD EXPECT: Some feelings of bloating in the abdomen. Passage of more gas than usual.  Walking can help get rid of the air that was put into your GI tract during the procedure and reduce the bloating. If you had a lower endoscopy (such as a colonoscopy or flexible sigmoidoscopy) you may notice spotting of blood in your stool or on the toilet paper. If you underwent a bowel prep for your procedure, you may not have a normal bowel movement for a few days.  Please Note:  You might notice some irritation and congestion in your nose or some drainage.  This is from the oxygen used during your procedure.  There is no need for concern and it should clear up in a day or so.  SYMPTOMS TO REPORT IMMEDIATELY:   Following lower endoscopy (colonoscopy or flexible sigmoidoscopy):  Excessive amounts of blood in the stool  Significant tenderness or worsening of abdominal pains  Swelling of the abdomen that is new, acute  Fever of 100F or higher    For urgent or emergent issues, a gastroenterologist can be reached at any hour by calling [phone].   DIET: Your first meal following the procedure should be a small meal and then it is ok to progress to your normal diet. Heavy or fried foods are harder to digest and may make you feel nauseous or bloated.  Likewise, meals heavy in dairy and vegetables can increase bloating.  Drink plenty of fluids but you should avoid alcoholic beverages for 24  hours.  ACTIVITY:  You should plan to take it easy for the rest of today and you should NOT DRIVE or use heavy machinery until tomorrow (because of the sedation medicines used during the test).    FOLLOW UP: Our staff will call the number listed on your records the next business day following your procedure to check on you and address any questions or concerns that you may have regarding the information given to you following your procedure. If we do not reach you, we will leave a message.  However, if you are feeling well and you are not experiencing any problems, there is no need to return our call.  We will assume that you have returned to your regular daily activities without incident.  If any biopsies were taken you will be contacted by phone or by letter within the next 1-3 weeks.  Please call us at [phone] if you have not heard about the biopsies in 3 weeks.    SIGNATURES/CONFIDENTIALITY: You and/or your care partner have signed paperwork which will be entered into your electronic medical record.  These signatures attest to the fact that that the information above on your After Visit Summary has been reviewed and is understood.  Full responsibility of the confidentiality of this discharge information lies with you and/or your care-partner.   Information on hemorrhoids given to you today 

## 2016-05-30 NOTE — Op Note (Signed)
[facility] Patient Name: [name] Procedure Date: 05/30/2016 1:16 PM MRN: [id number] Endoscopist: Milus Banister , MD Age: 45 Referring MD:  Date of Birth: [birth year] Gender: Male Procedure:                Colonoscopy Indications:              Evaluation of unexplained GI bleeding Medicines:                Monitored Anesthesia Care Procedure:                Pre-Anesthesia Assessment:                           - Prior to the procedure, a History and Physical                            was performed, and patient medications and                            allergies were reviewed. The patient's tolerance of                            previous anesthesia was also reviewed. The risks                            and benefits of the procedure and the sedation                            options and risks were discussed with the patient.                            All questions were answered, and informed consent                            was obtained. Prior Anticoagulants: The patient has                            taken no previous anticoagulant or antiplatelet                            agents. ASA Grade Assessment: I - A normal, healthy                            patient. After reviewing the risks and benefits,                            the patient was deemed in satisfactory condition to                            undergo the procedure.                           After obtaining informed consent, the colonoscope  was passed under direct vision. Throughout the                            procedure, the patient's blood pressure, pulse, and                            oxygen saturations were monitored continuously. The                            Model CF-HQ190L [id number]) scope was introduced                            through the anus and advanced to the the cecum,                            identified by appendiceal orifice and ileocecal                             valve. The colonoscopy was performed without                            difficulty. The patient tolerated the procedure                            well. The quality of the bowel preparation was                            excellent. The ileocecal valve, appendiceal                            orifice, and rectum were photographed. Scope In: 1:38:47 PM Scope Out: 1:46:59 PM Scope Withdrawal Time: 0 hours 6 minutes 22 seconds  Total Procedure Duration: 0 hours 8 minutes 12 seconds  Findings:                 Non-bleeding internal hemorrhoids were found during                            retroflexion. The hemorrhoids were small.                           The exam was otherwise without abnormality on                            direct and retroflexion views. Complications:            No immediate complications. Estimated blood loss:                            None. Estimated Blood Loss:     Estimated blood loss: none. Impression:               - Non-bleeding internal hemorrhoids. These are                            small, very likely the cause of  your minor rectal                            bleeding and no cause for concern.                           - The examination was otherwise normal on direct                            and retroflexion views.                           - No specimens collected. Recommendation:           - Patient has a contact number available for                            emergencies. The signs and symptoms of potential                            delayed complications were discussed with the                            patient. Return to normal activities tomorrow.                            Written discharge instructions were provided to the                            patient.                           - Resume previous diet.                           - Continue present medications.                           - Repeat colonoscopy in 10 years for screening                             purposes. Milus Banister, MD 05/30/2016 1:52:56 PM This report has been signed electronically.

## 2016-05-31 ENCOUNTER — Telehealth: Payer: Self-pay

## 2016-05-31 NOTE — Telephone Encounter (Signed)
  Follow up Call-  Call back number 05/30/2016  Post procedure Call Back phone  # [phone]  Permission to leave phone message Yes     Patient questions:  Do you have a fever, pain , or abdominal swelling? No. Pain Score  0 *  Have you tolerated food without any problems? Yes.    Have you been able to return to your normal activities? Yes.    Do you have any questions about your discharge instructions: Diet   No. Medications  No. Follow up visit  No.  Do you have questions or concerns about your Care? No.  Actions: * If pain score is 4 or above: No action needed, pain <4.

## 2016-07-13 ENCOUNTER — Telehealth: Payer: Self-pay

## 2016-07-13 MED ORDER — HYDROCORTISONE ACETATE 25 MG RE SUPP: 25.0000 mg | Freq: Every day | RECTAL | 0 refills | Status: DC

## 2016-07-13 NOTE — Telephone Encounter (Signed)
Discussed with Dr. Silverio Decamp in Dr. Eugenia Pancoast absence  anusol supp 1 q hs #12  Patient notified

## 2016-07-13 NOTE — Telephone Encounter (Signed)
Pt calls stating he would like to schedule a follow up with Dr Ardis Hughs. His next available is in Oct 2017. Pt now requests to talk to a nurse about his rectal bleeding. He is going out of town for the next 3 weeks. He would like a call back today.

## 2016-07-13 NOTE — Telephone Encounter (Signed)
Patient reports continued intermittent rectal bleeding.  He is going out of town for several weeks and is asking if anything can be prescribed for this.  Per procedure report 05/30/16 the likely cause of his rectal bleeding was small internal hemorrhoids.  Dr. Ardis Hughs please advise

## 2016-09-24 ENCOUNTER — Other Ambulatory Visit (INDEPENDENT_AMBULATORY_CARE_PROVIDER_SITE_OTHER): Payer: BLUE CROSS/BLUE SHIELD

## 2016-09-24 ENCOUNTER — Encounter: Payer: Self-pay | Admitting: Internal Medicine

## 2016-09-24 ENCOUNTER — Ambulatory Visit (INDEPENDENT_AMBULATORY_CARE_PROVIDER_SITE_OTHER): Payer: BLUE CROSS/BLUE SHIELD | Admitting: Internal Medicine

## 2016-09-24 DIAGNOSIS — Z23 Encounter for immunization: Secondary | ICD-10-CM | POA: Diagnosis not present

## 2016-09-24 DIAGNOSIS — R21 Rash and other nonspecific skin eruption: Secondary | ICD-10-CM | POA: Diagnosis not present

## 2016-09-24 DIAGNOSIS — R7989 Other specified abnormal findings of blood chemistry: Secondary | ICD-10-CM

## 2016-09-24 DIAGNOSIS — Z0000 Encounter for general adult medical examination without abnormal findings: Secondary | ICD-10-CM | POA: Diagnosis not present

## 2016-09-24 LAB — URINALYSIS
BILIRUBIN URINE: NEGATIVE
Hgb urine dipstick: NEGATIVE
KETONES UR: NEGATIVE
Leukocytes, UA: NEGATIVE
NITRITE: NEGATIVE
PH: 8 (ref 5.0–8.0)
SPECIFIC GRAVITY, URINE: 1.015 (ref 1.000–1.030)
TOTAL PROTEIN, URINE-UPE24: NEGATIVE
Urine Glucose: NEGATIVE
Urobilinogen, UA: 0.2 (ref 0.0–1.0)

## 2016-09-24 LAB — CBC WITH DIFFERENTIAL/PLATELET
BASOS ABS: 0.1 10*3/uL (ref 0.0–0.1)
Basophils Relative: 1 % (ref 0.0–3.0)
EOS ABS: 0.2 10*3/uL (ref 0.0–0.7)
Eosinophils Relative: 2.6 % (ref 0.0–5.0)
HCT: 49.5 % (ref 39.0–52.0)
Hemoglobin: 17.4 g/dL — ABNORMAL HIGH (ref 13.0–17.0)
Lymphocytes Relative: 36.8 % (ref 12.0–46.0)
Lymphs Abs: 3 10*3/uL (ref 0.7–4.0)
MCHC: 35.2 g/dL (ref 30.0–36.0)
MCV: 83.1 fl (ref 78.0–100.0)
MONO ABS: 0.4 10*3/uL (ref 0.1–1.0)
Monocytes Relative: 5.5 % (ref 3.0–12.0)
NEUTROS PCT: 54.1 % (ref 43.0–77.0)
Neutro Abs: 4.4 10*3/uL (ref 1.4–7.7)
Platelets: 191 10*3/uL (ref 150.0–400.0)
RBC: 5.96 Mil/uL — AB (ref 4.22–5.81)
RDW: 12.8 % (ref 11.5–15.5)
WBC: 8.1 10*3/uL (ref 4.0–10.5)

## 2016-09-24 LAB — LDL CHOLESTEROL, DIRECT: Direct LDL: 139 mg/dL

## 2016-09-24 LAB — HEPATIC FUNCTION PANEL
ALK PHOS: 88 U/L (ref 39–117)
ALT: 53 U/L (ref 0–53)
AST: 25 U/L (ref 0–37)
Albumin: 4.6 g/dL (ref 3.5–5.2)
BILIRUBIN TOTAL: 0.9 mg/dL (ref 0.2–1.2)
Bilirubin, Direct: 0.1 mg/dL (ref 0.0–0.3)
Total Protein: 7.5 g/dL (ref 6.0–8.3)

## 2016-09-24 LAB — LIPID PANEL
CHOL/HDL RATIO: 5
Cholesterol: 227 mg/dL — ABNORMAL HIGH (ref 0–200)
HDL: 41.3 mg/dL (ref >39.00)
NonHDL: 185.28
Triglycerides: 267 mg/dL — ABNORMAL HIGH (ref 0.0–149.0)
VLDL: 53.4 mg/dL — AB (ref 0.0–40.0)

## 2016-09-24 LAB — BASIC METABOLIC PANEL
BUN: 15 mg/dL (ref 6–23)
CHLORIDE: 102 meq/L (ref 96–112)
CO2: 27 meq/L (ref 19–32)
Calcium: 10.1 mg/dL (ref 8.4–10.5)
Creatinine, Ser: 0.97 mg/dL (ref 0.40–1.50)
GFR: 89.43 mL/min (ref >60.00)
GLUCOSE: 88 mg/dL (ref 70–99)
POTASSIUM: 4.6 meq/L (ref 3.5–5.1)
SODIUM: 138 meq/L (ref 135–145)

## 2016-09-24 LAB — PSA: PSA: 1.7 ng/mL (ref 0.10–4.00)

## 2016-09-24 LAB — TSH: TSH: 1.72 u[IU]/mL (ref 0.35–4.50)

## 2016-09-24 MED ORDER — PROMETHAZINE-CODEINE 6.25-10 MG/5ML PO SYRP: 5.0000 mL | ORAL_SOLUTION | ORAL | 0 refills | Status: DC | PRN

## 2016-09-24 MED ORDER — CLOTRIMAZOLE-BETAMETHASONE 1-0.05 % EX CREA: 1.0000 "application " | TOPICAL_CREAM | Freq: Two times a day (BID) | CUTANEOUS | 1 refills | Status: DC

## 2016-09-24 NOTE — Progress Notes (Signed)
Subjective:  Patient ID: [name], male    DOB: 1970-12-26  Age: 45 y.o. MRN: [id number]  CC: Annual Exam   HPI [name] presents for a well exam  C/o penile lesion x 6 mo.   Outpatient Medications Prior to Visit  Medication Sig Dispense Refill  . cholecalciferol (VITAMIN D) 1000 UNITS tablet Take 1,000 Units by mouth daily.      . hydrocortisone (ANUSOL-HC) 25 MG suppository Place 1 suppository (25 mg total) rectally at bedtime. 12 suppository 0  . omega-3 acid ethyl esters (LOVAZA) 1 G capsule TAKE 2 CAPSULES BY MOUTH TWICE A DAY 360 capsule 3   No facility-administered medications prior to visit.     ROS Review of Systems  Constitutional: Negative for appetite change, fatigue and unexpected weight change.  HENT: Negative for congestion, nosebleeds, sneezing, sore throat and trouble swallowing.   Eyes: Negative for itching and visual disturbance.  Respiratory: Negative for cough.   Cardiovascular: Negative for chest pain, palpitations and leg swelling.  Gastrointestinal: Negative for abdominal distention, blood in stool, diarrhea and nausea.  Genitourinary: Negative for frequency and hematuria.  Musculoskeletal: Negative for back pain, gait problem, joint swelling and neck pain.  Skin: Negative for rash.  Neurological: Negative for dizziness, tremors, speech difficulty and weakness.  Psychiatric/Behavioral: Negative for agitation, dysphoric mood and sleep disturbance. The patient is not nervous/anxious.     Objective:  BP 110/72   Pulse 81   Temp 98.9 F (37.2 C) (Oral)   Ht 5\' 3"  (1.6 m)   Wt 173 lb (78.5 kg)   SpO2 97%   BMI 30.65 kg/m   BP Readings from Last 3 Encounters:  09/24/16 110/72  05/30/16 113/80  05/02/16 106/70    Wt Readings from Last 3 Encounters:  09/24/16 173 lb (78.5 kg)  05/30/16 170 lb (77.1 kg)  05/02/16 170 lb 8 oz (77.3 kg)    Physical Exam  Constitutional: He is oriented to person, place, and time. He appears  well-developed and well-nourished. No distress.  HENT:  Head: Normocephalic and atraumatic.  Right Ear: External ear normal.  Left Ear: External ear normal.  Nose: Nose normal.  Mouth/Throat: Oropharynx is clear and moist. No oropharyngeal exudate.  Eyes: Conjunctivae and EOM are normal. Pupils are equal, round, and reactive to light. Right eye exhibits no discharge. Left eye exhibits no discharge. No scleral icterus.  Neck: Normal range of motion. Neck supple. No JVD present. No tracheal deviation present. No thyromegaly present.  Cardiovascular: Normal rate, regular rhythm, normal heart sounds and intact distal pulses.  Exam reveals no gallop and no friction rub.   No murmur heard. Pulmonary/Chest: Effort normal and breath sounds normal. No stridor. No respiratory distress. He has no wheezes. He has no rales. He exhibits no tenderness.  Abdominal: Soft. Bowel sounds are normal. He exhibits no distension and no mass. There is no tenderness. There is no rebound and no guarding.  Musculoskeletal: Normal range of motion. He exhibits no edema or tenderness.  Lymphadenopathy:    He has no cervical adenopathy.  Neurological: He is alert and oriented to person, place, and time. He has normal reflexes. No cranial nerve deficit. He exhibits normal muscle tone. Coordination normal.  Skin: Skin is warm and dry. No rash noted. He is not diaphoretic. No erythema. No pallor.  Psychiatric: He has a normal mood and affect. His behavior is normal. Judgment and thought content normal.  Glans penis with dorsal irritation under foreskin  Lab Results  Component Value Date   WBC 6.4 03/06/2016   HGB 16.5 03/06/2016   HCT 47.2 03/06/2016   PLT 288.0 03/06/2016   GLUCOSE 91 09/13/2015   CHOL 189 09/13/2015   TRIG 178.0 (H) 09/13/2015   HDL 34.50 (L) 09/13/2015   LDLDIRECT 138.3 08/31/2013   LDLCALC 119 (H) 09/13/2015   ALT 47 09/13/2015   AST 23 09/13/2015   NA 139 09/13/2015   K 4.0 09/13/2015   CL  103 09/13/2015   CREATININE 0.97 09/13/2015   BUN 9 09/13/2015   CO2 28 09/13/2015   TSH 1.06 09/13/2015   PSA 1.41 09/13/2015   INR 1.1 (H) 03/06/2016    No results found.  Assessment & Plan:   There are no diagnoses linked to this encounter. I am having Mr. Mueth maintain his cholecalciferol, omega-3 acid ethyl esters, and hydrocortisone.  No orders of the defined types were placed in this encounter.    Follow-up: No Follow-up on file.  Walker Kehr, MD

## 2016-09-24 NOTE — Assessment & Plan Note (Signed)
We discussed age appropriate health related issues, including available/recomended screening tests and vaccinations. We discussed a need for adhering to healthy diet and exercise. Labs/EKG were reviewed/ordered. All questions were answered.   

## 2016-09-24 NOTE — Progress Notes (Signed)
Pre visit review using our clinic review tool, if applicable. No additional management support is needed unless otherwise documented below in the visit note. 

## 2016-09-30 NOTE — Assessment & Plan Note (Signed)
Glans penis with dorsal irritation under foreskin -- Lotrisone bid Rx RTC if not better

## 2016-10-16 ENCOUNTER — Other Ambulatory Visit: Payer: Self-pay | Admitting: Internal Medicine

## 2016-12-26 ENCOUNTER — Telehealth: Payer: Self-pay

## 2016-12-26 NOTE — Telephone Encounter (Signed)
Tried to initiate Pa, patient is not covered with insurance

## 2016-12-26 NOTE — Telephone Encounter (Signed)
PA started for lovaza 1 GM cap

## 2017-01-25 ENCOUNTER — Ambulatory Visit (INDEPENDENT_AMBULATORY_CARE_PROVIDER_SITE_OTHER): Payer: BLUE CROSS/BLUE SHIELD | Admitting: Internal Medicine

## 2017-01-25 ENCOUNTER — Encounter: Payer: Self-pay | Admitting: Internal Medicine

## 2017-01-25 ENCOUNTER — Other Ambulatory Visit (INDEPENDENT_AMBULATORY_CARE_PROVIDER_SITE_OTHER): Payer: BLUE CROSS/BLUE SHIELD

## 2017-01-25 DIAGNOSIS — D582 Other hemoglobinopathies: Secondary | ICD-10-CM | POA: Insufficient documentation

## 2017-01-25 DIAGNOSIS — K921 Melena: Secondary | ICD-10-CM

## 2017-01-25 LAB — CBC WITH DIFFERENTIAL/PLATELET
BASOS ABS: 0.1 10*3/uL (ref 0.0–0.1)
Basophils Relative: 0.8 % (ref 0.0–3.0)
EOS ABS: 0.1 10*3/uL (ref 0.0–0.7)
Eosinophils Relative: 2.3 % (ref 0.0–5.0)
HCT: 46.6 % (ref 39.0–52.0)
Hemoglobin: 16.7 g/dL (ref 13.0–17.0)
LYMPHS ABS: 2.4 10*3/uL (ref 0.7–4.0)
Lymphocytes Relative: 38.3 % (ref 12.0–46.0)
MCHC: 35.9 g/dL (ref 30.0–36.0)
MCV: 82.3 fl (ref 78.0–100.0)
MONO ABS: 0.5 10*3/uL (ref 0.1–1.0)
MONOS PCT: 8.2 % (ref 3.0–12.0)
NEUTROS ABS: 3.2 10*3/uL (ref 1.4–7.7)
NEUTROS PCT: 50.4 % (ref 43.0–77.0)
PLATELETS: 337 10*3/uL (ref 150.0–400.0)
RBC: 5.66 Mil/uL (ref 4.22–5.81)
RDW: 12.9 % (ref 11.5–15.5)
WBC: 6.3 10*3/uL (ref 4.0–10.5)

## 2017-01-25 LAB — HEPATIC FUNCTION PANEL
ALBUMIN: 4.6 g/dL (ref 3.5–5.2)
ALT: 76 U/L — AB (ref 0–53)
AST: 38 U/L — ABNORMAL HIGH (ref 0–37)
Alkaline Phosphatase: 84 U/L (ref 39–117)
Bilirubin, Direct: 0.1 mg/dL (ref 0.0–0.3)
TOTAL PROTEIN: 7.3 g/dL (ref 6.0–8.3)
Total Bilirubin: 0.7 mg/dL (ref 0.2–1.2)

## 2017-01-25 LAB — BASIC METABOLIC PANEL
BUN: 11 mg/dL (ref 6–23)
CHLORIDE: 103 meq/L (ref 96–112)
CO2: 29 meq/L (ref 19–32)
Calcium: 9.6 mg/dL (ref 8.4–10.5)
Creatinine, Ser: 0.94 mg/dL (ref 0.40–1.50)
GFR: 92.58 mL/min (ref >60.00)
Glucose, Bld: 90 mg/dL (ref 70–99)
POTASSIUM: 4 meq/L (ref 3.5–5.1)
SODIUM: 138 meq/L (ref 135–145)

## 2017-01-25 LAB — LIPID PANEL
CHOL/HDL RATIO: 5
CHOLESTEROL: 187 mg/dL (ref 0–200)
HDL: 36.1 mg/dL — AB (ref >39.00)
LDL CALC: 118 mg/dL — AB (ref 0–99)
NonHDL: 151.33
TRIGLYCERIDES: 167 mg/dL — AB (ref 0.0–149.0)
VLDL: 33.4 mg/dL (ref 0.0–40.0)

## 2017-01-25 MED ORDER — HYDROCORTISONE ACETATE 25 MG RE SUPP
25.0000 mg | Freq: Every day | RECTAL | 1 refills | Status: DC
Start: 2017-01-25 — End: 2017-08-13

## 2017-01-25 NOTE — Progress Notes (Signed)
Pre visit review using our clinic review tool, if applicable. No additional management support is needed unless otherwise documented below in the visit note. 

## 2017-01-25 NOTE — Assessment & Plan Note (Signed)
labs

## 2017-01-25 NOTE — Progress Notes (Signed)
Subjective:  Patient ID: [name], male    DOB: Dec 21, 1970  Age: 46 y.o. MRN: [id number]  CC: No chief complaint on file.   HPI [name] presents for dyslipidemia, occ bleeding hemorrhoids 2/year  Outpatient Medications Prior to Visit  Medication Sig Dispense Refill  . cholecalciferol (VITAMIN D) 1000 UNITS tablet Take 1,000 Units by mouth daily.      . hydrocortisone (ANUSOL-HC) 25 MG suppository Place 1 suppository (25 mg total) rectally at bedtime. 12 suppository 0  . omega-3 acid ethyl esters (LOVAZA) 1 g capsule TAKE 2 CAPSULES BY MOUTH TWICE A DAY 360 capsule 0  . clotrimazole-betamethasone (LOTRISONE) cream Apply 1 application topically 2 (two) times daily. (Patient not taking: Reported on 01/25/2017) 30 g 1  . promethazine-codeine (PHENERGAN WITH CODEINE) 6.25-10 MG/5ML syrup Take 5 mLs by mouth every 4 (four) hours as needed for cough. (Patient not taking: Reported on 01/25/2017) 300 mL 0   No facility-administered medications prior to visit.     ROS Review of Systems  Constitutional: Negative for appetite change, fatigue and unexpected weight change.  HENT: Negative for congestion, nosebleeds, sneezing, sore throat and trouble swallowing.   Eyes: Negative for itching and visual disturbance.  Respiratory: Negative for cough.   Cardiovascular: Negative for chest pain, palpitations and leg swelling.  Gastrointestinal: Positive for blood in stool. Negative for abdominal distention, diarrhea and nausea.  Genitourinary: Negative for frequency and hematuria.  Musculoskeletal: Negative for back pain, gait problem, joint swelling and neck pain.  Skin: Negative for rash.  Neurological: Negative for dizziness, tremors, speech difficulty and weakness.  Psychiatric/Behavioral: Negative for agitation, dysphoric mood and sleep disturbance. The patient is not nervous/anxious.     Objective:  BP 130/70   Pulse 80   Temp 99.1 F (37.3 C) (Oral)   Resp 20   Wt 171 lb 4 oz  (77.7 kg)   SpO2 96%   BMI 30.34 kg/m   BP Readings from Last 3 Encounters:  01/25/17 130/70  09/24/16 110/72  05/30/16 113/80    Wt Readings from Last 3 Encounters:  01/25/17 171 lb 4 oz (77.7 kg)  09/24/16 173 lb (78.5 kg)  05/30/16 170 lb (77.1 kg)    Physical Exam  Constitutional: He is oriented to person, place, and time. He appears well-developed. No distress.  NAD  HENT:  Mouth/Throat: Oropharynx is clear and moist.  Eyes: Conjunctivae are normal. Pupils are equal, round, and reactive to light.  Neck: Normal range of motion. No JVD present. No thyromegaly present.  Cardiovascular: Normal rate, regular rhythm, normal heart sounds and intact distal pulses.  Exam reveals no gallop and no friction rub.   No murmur heard. Pulmonary/Chest: Effort normal and breath sounds normal. No respiratory distress. He has no wheezes. He has no rales. He exhibits no tenderness.  Abdominal: Soft. Bowel sounds are normal. He exhibits no distension and no mass. There is no tenderness. There is no rebound and no guarding.  Musculoskeletal: Normal range of motion. He exhibits no edema or tenderness.  Lymphadenopathy:    He has no cervical adenopathy.  Neurological: He is alert and oriented to person, place, and time. He has normal reflexes. No cranial nerve deficit. He exhibits normal muscle tone. He displays a negative Romberg sign. Coordination and gait normal.  Skin: Skin is warm and dry. No rash noted.  Psychiatric: He has a normal mood and affect. His behavior is normal. Judgment and thought content normal.    Lab Results  Component Value  Date   WBC 8.1 09/24/2016   HGB 17.4 (H) 09/24/2016   HCT 49.5 09/24/2016   PLT  09/24/2016    191.0 Result may be falsely decreased due to platelet clumping.   GLUCOSE 88 09/24/2016   CHOL 227 (H) 09/24/2016   TRIG 267.0 (H) 09/24/2016   HDL 41.30 09/24/2016   LDLDIRECT 139.0 09/24/2016   LDLCALC 119 (H) 09/13/2015   ALT 53 09/24/2016   AST 25  09/24/2016   NA 138 09/24/2016   K 4.6 09/24/2016   CL 102 09/24/2016   CREATININE 0.97 09/24/2016   BUN 15 09/24/2016   CO2 27 09/24/2016   TSH 1.72 09/24/2016   PSA 1.70 09/24/2016   INR 1.1 (H) 03/06/2016    No results found.  Assessment & Plan:   There are no diagnoses linked to this encounter. I am having Mr. Habash maintain his cholecalciferol, hydrocortisone, promethazine-codeine, clotrimazole-betamethasone, and omega-3 acid ethyl esters.  No orders of the defined types were placed in this encounter.    Follow-up: No Follow-up on file.  Walker Kehr, MD

## 2017-01-25 NOTE — Assessment & Plan Note (Signed)
Colon 2017 - internal hemorrhoids

## 2017-01-25 NOTE — Assessment & Plan Note (Signed)
Denies OSA Blood donation

## 2017-04-26 ENCOUNTER — Telehealth: Payer: Self-pay | Admitting: Internal Medicine

## 2017-04-26 NOTE — Telephone Encounter (Signed)
PA was denied, please advise about medication.

## 2017-04-26 NOTE — Telephone Encounter (Signed)
Patient would like to know if PA for Lovaza was received yesterday from [facility].   Script INS:  rx bin [id number], [place] [employer], rxGroup [id number], issuer [phone]) [id number], ID [id number] customer care line: [phone] or pharmacists:  [phone] [facility]

## 2017-04-29 NOTE — Telephone Encounter (Signed)
Can he get a generic Lovaza? Thx

## 2017-04-30 NOTE — Telephone Encounter (Signed)
He will have to use OTC Omega 3 Thx

## 2017-04-30 NOTE — Telephone Encounter (Signed)
PA was for generic Lovaza, it was denied because patient does not have or had a Triglyceride level of greater than or equal to 500 mg/dL.

## 2017-05-02 NOTE — Telephone Encounter (Signed)
Pt.notified

## 2017-05-14 ENCOUNTER — Telehealth: Payer: Self-pay

## 2017-05-14 NOTE — Telephone Encounter (Signed)
Key: [id number]

## 2017-06-10 ENCOUNTER — Other Ambulatory Visit: Payer: Self-pay

## 2017-06-10 MED ORDER — OMEGA-3-ACID ETHYL ESTERS 1 G PO CAPS: 2.0000 | ORAL_CAPSULE | Freq: Two times a day (BID) | ORAL | 0 refills | Status: DC

## 2017-08-13 ENCOUNTER — Encounter: Payer: Self-pay | Admitting: Internal Medicine

## 2017-08-13 ENCOUNTER — Ambulatory Visit (INDEPENDENT_AMBULATORY_CARE_PROVIDER_SITE_OTHER): Payer: BLUE CROSS/BLUE SHIELD | Admitting: Internal Medicine

## 2017-08-13 ENCOUNTER — Other Ambulatory Visit (INDEPENDENT_AMBULATORY_CARE_PROVIDER_SITE_OTHER): Payer: BLUE CROSS/BLUE SHIELD

## 2017-08-13 VITALS — BP 122/84 | HR 80 | Temp 98.8°F | Ht 63.0 in | Wt 171.0 lb

## 2017-08-13 DIAGNOSIS — Z0000 Encounter for general adult medical examination without abnormal findings: Secondary | ICD-10-CM

## 2017-08-13 DIAGNOSIS — E785 Hyperlipidemia, unspecified: Secondary | ICD-10-CM | POA: Diagnosis not present

## 2017-08-13 DIAGNOSIS — D485 Neoplasm of uncertain behavior of skin: Secondary | ICD-10-CM | POA: Diagnosis not present

## 2017-08-13 LAB — CBC WITH DIFFERENTIAL/PLATELET
BASOS ABS: 0 10*3/uL (ref 0.0–0.1)
BASOS PCT: 0.6 % (ref 0.0–3.0)
EOS ABS: 0.2 10*3/uL (ref 0.0–0.7)
Eosinophils Relative: 2.8 % (ref 0.0–5.0)
HCT: 45.3 % (ref 39.0–52.0)
Hemoglobin: 16 g/dL (ref 13.0–17.0)
LYMPHS ABS: 2.5 10*3/uL (ref 0.7–4.0)
LYMPHS PCT: 40 % (ref 12.0–46.0)
MCHC: 35.4 g/dL (ref 30.0–36.0)
MCV: 83.3 fl (ref 78.0–100.0)
MONO ABS: 0.5 10*3/uL (ref 0.1–1.0)
Monocytes Relative: 7.4 % (ref 3.0–12.0)
NEUTROS ABS: 3 10*3/uL (ref 1.4–7.7)
NEUTROS PCT: 49.2 % (ref 43.0–77.0)
PLATELETS: 279 10*3/uL (ref 150.0–400.0)
RBC: 5.43 Mil/uL (ref 4.22–5.81)
RDW: 13.5 % (ref 11.5–15.5)
WBC: 6.2 10*3/uL (ref 4.0–10.5)

## 2017-08-13 LAB — URINALYSIS
BILIRUBIN URINE: NEGATIVE
HGB URINE DIPSTICK: NEGATIVE
Ketones, ur: NEGATIVE
Leukocytes, UA: NEGATIVE
NITRITE: NEGATIVE
PH: 7.5 (ref 5.0–8.0)
Specific Gravity, Urine: 1.015 (ref 1.000–1.030)
TOTAL PROTEIN, URINE-UPE24: NEGATIVE
URINE GLUCOSE: NEGATIVE
Urobilinogen, UA: 0.2 (ref 0.0–1.0)

## 2017-08-13 LAB — HEPATIC FUNCTION PANEL
ALT: 25 U/L (ref 0–53)
AST: 18 U/L (ref 0–37)
Albumin: 4.6 g/dL (ref 3.5–5.2)
Alkaline Phosphatase: 74 U/L (ref 39–117)
BILIRUBIN DIRECT: 0.1 mg/dL (ref 0.0–0.3)
BILIRUBIN TOTAL: 0.9 mg/dL (ref 0.2–1.2)
Total Protein: 7.2 g/dL (ref 6.0–8.3)

## 2017-08-13 LAB — BASIC METABOLIC PANEL
BUN: 10 mg/dL (ref 6–23)
CALCIUM: 10 mg/dL (ref 8.4–10.5)
CO2: 29 mEq/L (ref 19–32)
CREATININE: 0.91 mg/dL (ref 0.40–1.50)
Chloride: 102 mEq/L (ref 96–112)
GFR: 95.87 mL/min (ref >60.00)
GLUCOSE: 92 mg/dL (ref 70–99)
Potassium: 4 mEq/L (ref 3.5–5.1)
Sodium: 138 mEq/L (ref 135–145)

## 2017-08-13 LAB — TSH: TSH: 1.21 u[IU]/mL (ref 0.35–4.50)

## 2017-08-13 LAB — LDL CHOLESTEROL, DIRECT: Direct LDL: 115 mg/dL

## 2017-08-13 LAB — LIPID PANEL
CHOL/HDL RATIO: 6
CHOLESTEROL: 203 mg/dL — AB (ref 0–200)
HDL: 32.3 mg/dL — ABNORMAL LOW (ref >39.00)
NONHDL: 170.65
Triglycerides: 240 mg/dL — ABNORMAL HIGH (ref 0.0–149.0)
VLDL: 48 mg/dL — AB (ref 0.0–40.0)

## 2017-08-13 LAB — PSA: PSA: 2.25 ng/mL (ref 0.10–4.00)

## 2017-08-13 MED ORDER — ICOSAPENT ETHYL 1 G PO CAPS: 2.0000 | ORAL_CAPSULE | Freq: Two times a day (BID) | ORAL | 11 refills | Status: DC

## 2017-08-13 NOTE — Progress Notes (Signed)
Subjective:  Patient ID: [name], male    DOB: 06/27/1971  Age: 46 y.o. MRN: [id number]  CC: No chief complaint on file.   HPI [name] presents for a well exam  Outpatient Medications Prior to Visit  Medication Sig Dispense Refill  . cholecalciferol (VITAMIN D) 1000 UNITS tablet Take 1,000 Units by mouth daily.      . hydrocortisone (ANUSOL-HC) 25 MG suppository Place 1 suppository (25 mg total) rectally at bedtime. (Patient not taking: Reported on 08/13/2017) 12 suppository 1  . omega-3 acid ethyl esters (LOVAZA) 1 g capsule Take 2 capsules (2 g total) by mouth 2 (two) times daily. (Patient not taking: Reported on 08/13/2017) 360 capsule 0   No facility-administered medications prior to visit.     ROS Review of Systems  Constitutional: Negative for appetite change, fatigue and unexpected weight change.  HENT: Negative for congestion, nosebleeds, sneezing, sore throat and trouble swallowing.   Eyes: Negative for itching and visual disturbance.  Respiratory: Negative for cough.   Cardiovascular: Negative for chest pain, palpitations and leg swelling.  Gastrointestinal: Negative for abdominal distention, blood in stool, diarrhea and nausea.  Genitourinary: Negative for frequency and hematuria.  Musculoskeletal: Negative for back pain, gait problem, joint swelling and neck pain.  Skin: Negative for rash.  Neurological: Negative for dizziness, tremors, speech difficulty and weakness.  Psychiatric/Behavioral: Negative for agitation, dysphoric mood and sleep disturbance. The patient is nervous/anxious.     Objective:  BP 122/84 (BP Location: Right Arm, Patient Position: Sitting, Cuff Size: Normal)   Pulse 80   Temp 98.8 F (37.1 C) (Oral)   Ht 5\' 3"  (1.6 m)   Wt 171 lb (77.6 kg)   SpO2 98%   BMI 30.29 kg/m   BP Readings from Last 3 Encounters:  08/13/17 122/84  01/25/17 130/70  09/24/16 110/72    Wt Readings from Last 3 Encounters:  08/13/17 171 lb (77.6 kg)    01/25/17 171 lb 4 oz (77.7 kg)  09/24/16 173 lb (78.5 kg)    Physical Exam  Constitutional: He is oriented to person, place, and time. He appears well-developed. No distress.  NAD  HENT:  Mouth/Throat: Oropharynx is clear and moist.  Eyes: Pupils are equal, round, and reactive to light. Conjunctivae are normal.  Neck: Normal range of motion. No JVD present. No thyromegaly present.  Cardiovascular: Normal rate, regular rhythm, normal heart sounds and intact distal pulses.  Exam reveals no gallop and no friction rub.   No murmur heard. Pulmonary/Chest: Effort normal and breath sounds normal. No respiratory distress. He has no wheezes. He has no rales. He exhibits no tenderness.  Abdominal: Soft. Bowel sounds are normal. He exhibits no distension and no mass. There is no tenderness. There is no rebound and no guarding.  Musculoskeletal: Normal range of motion. He exhibits no edema or tenderness.  Lymphadenopathy:    He has no cervical adenopathy.  Neurological: He is alert and oriented to person, place, and time. He has normal reflexes. No cranial nerve deficit. He exhibits normal muscle tone. He displays a negative Romberg sign. Coordination and gait normal.  Skin: Skin is warm and dry. No rash noted.  Psychiatric: He has a normal mood and affect. His behavior is normal. Judgment and thought content normal.    Lab Results  Component Value Date   WBC 6.3 01/25/2017   HGB 16.7 01/25/2017   HCT 46.6 01/25/2017   PLT 337.0 01/25/2017   GLUCOSE 90 01/25/2017   CHOL 187  01/25/2017   TRIG 167.0 (H) 01/25/2017   HDL 36.10 (L) 01/25/2017   LDLDIRECT 139.0 09/24/2016   LDLCALC 118 (H) 01/25/2017   ALT 76 (H) 01/25/2017   AST 38 (H) 01/25/2017   NA 138 01/25/2017   K 4.0 01/25/2017   CL 103 01/25/2017   CREATININE 0.94 01/25/2017   BUN 11 01/25/2017   CO2 29 01/25/2017   TSH 1.72 09/24/2016   PSA 1.70 09/24/2016   INR 1.1 (H) 03/06/2016    No results found.  Assessment & Plan:    There are no diagnoses linked to this encounter. I have discontinued Mr. Rotan hydrocortisone and omega-3 acid ethyl esters. I am also having him maintain his cholecalciferol.  No orders of the defined types were placed in this encounter.    Follow-up: No Follow-up on file.  Walker Kehr, MD

## 2017-08-13 NOTE — Assessment & Plan Note (Addendum)
Declined a statin Lovaza - too $$$ Trial of Vascepa

## 2017-08-13 NOTE — Assessment & Plan Note (Signed)
R temple - bx adviced

## 2017-08-13 NOTE — Assessment & Plan Note (Signed)
We discussed age appropriate health related issues, including available/recomended screening tests and vaccinations. We discussed a need for adhering to healthy diet and exercise. Labs were ordered to be later reviewed . All questions were answered.   

## 2017-10-15 ENCOUNTER — Other Ambulatory Visit: Payer: Self-pay

## 2017-10-15 NOTE — Telephone Encounter (Signed)
error 

## 2017-11-18 ENCOUNTER — Other Ambulatory Visit: Payer: Self-pay | Admitting: Internal Medicine

## 2017-11-21 ENCOUNTER — Telehealth: Payer: Self-pay | Admitting: *Deleted

## 2017-11-21 NOTE — Telephone Encounter (Signed)
PA sent in for Omega-3-acid Ethyl Esters 1GM OR CAPS (Key: [id number])  Status: PA Request

## 2017-11-22 ENCOUNTER — Other Ambulatory Visit: Payer: Self-pay | Admitting: Internal Medicine

## 2017-12-05 NOTE — Telephone Encounter (Signed)
PA denied, pt on OTC fish oil

## 2018-01-21 ENCOUNTER — Telehealth: Payer: Self-pay

## 2018-01-21 NOTE — Telephone Encounter (Signed)
Key: [id number]  Prior authorization started today forOmega-3-acid Ethyl Esters 1GM OR CAPS

## 2018-01-21 NOTE — Telephone Encounter (Signed)
Key: [id number]  Prior authorization started today forOmega-3-acid Ethyl Esters 1GM OR CAPS

## 2018-02-13 ENCOUNTER — Ambulatory Visit: Payer: BLUE CROSS/BLUE SHIELD | Admitting: Internal Medicine

## 2018-03-10 ENCOUNTER — Ambulatory Visit: Payer: BLUE CROSS/BLUE SHIELD | Admitting: Internal Medicine

## 2018-03-10 ENCOUNTER — Encounter: Payer: Self-pay | Admitting: Internal Medicine

## 2018-03-10 ENCOUNTER — Other Ambulatory Visit (INDEPENDENT_AMBULATORY_CARE_PROVIDER_SITE_OTHER): Payer: BLUE CROSS/BLUE SHIELD

## 2018-03-10 VITALS — BP 124/80 | HR 77 | Temp 98.4°F | Ht 63.0 in | Wt 179.0 lb

## 2018-03-10 DIAGNOSIS — D582 Other hemoglobinopathies: Secondary | ICD-10-CM | POA: Diagnosis not present

## 2018-03-10 DIAGNOSIS — R030 Elevated blood-pressure reading, without diagnosis of hypertension: Secondary | ICD-10-CM | POA: Diagnosis not present

## 2018-03-10 DIAGNOSIS — E785 Hyperlipidemia, unspecified: Secondary | ICD-10-CM | POA: Diagnosis not present

## 2018-03-10 DIAGNOSIS — H539 Unspecified visual disturbance: Secondary | ICD-10-CM

## 2018-03-10 LAB — BASIC METABOLIC PANEL
BUN: 10 mg/dL (ref 6–23)
CALCIUM: 9.7 mg/dL (ref 8.4–10.5)
CO2: 28 mEq/L (ref 19–32)
Chloride: 103 mEq/L (ref 96–112)
Creatinine, Ser: 0.88 mg/dL (ref 0.40–1.50)
GFR: 99.4 mL/min (ref >60.00)
Glucose, Bld: 95 mg/dL (ref 70–99)
Potassium: 4 mEq/L (ref 3.5–5.1)
SODIUM: 138 meq/L (ref 135–145)

## 2018-03-10 LAB — CBC WITH DIFFERENTIAL/PLATELET
Basophils Absolute: 0 10*3/uL (ref 0.0–0.1)
Basophils Relative: 0.6 % (ref 0.0–3.0)
Eosinophils Absolute: 0.2 10*3/uL (ref 0.0–0.7)
Eosinophils Relative: 2.6 % (ref 0.0–5.0)
HEMATOCRIT: 46.9 % (ref 39.0–52.0)
HEMOGLOBIN: 16.7 g/dL (ref 13.0–17.0)
LYMPHS PCT: 34.4 % (ref 12.0–46.0)
Lymphs Abs: 2.3 10*3/uL (ref 0.7–4.0)
MCHC: 35.6 g/dL (ref 30.0–36.0)
MCV: 81.6 fl (ref 78.0–100.0)
MONOS PCT: 6.7 % (ref 3.0–12.0)
Monocytes Absolute: 0.4 10*3/uL (ref 0.1–1.0)
Neutro Abs: 3.7 10*3/uL (ref 1.4–7.7)
Neutrophils Relative %: 55.7 % (ref 43.0–77.0)
Platelets: 284 10*3/uL (ref 150.0–400.0)
RBC: 5.75 Mil/uL (ref 4.22–5.81)
RDW: 13 % (ref 11.5–15.5)
WBC: 6.6 10*3/uL (ref 4.0–10.5)

## 2018-03-10 LAB — HEPATIC FUNCTION PANEL
ALBUMIN: 4.1 g/dL (ref 3.5–5.2)
ALT: 56 U/L — AB (ref 0–53)
AST: 26 U/L (ref 0–37)
Alkaline Phosphatase: 79 U/L (ref 39–117)
Bilirubin, Direct: 0.1 mg/dL (ref 0.0–0.3)
TOTAL PROTEIN: 7.6 g/dL (ref 6.0–8.3)
Total Bilirubin: 0.7 mg/dL (ref 0.2–1.2)

## 2018-03-10 LAB — LIPID PANEL
CHOLESTEROL: 187 mg/dL (ref 0–200)
HDL: 38.3 mg/dL — AB (ref >39.00)
LDL Cholesterol: 109 mg/dL — ABNORMAL HIGH (ref 0–99)
NonHDL: 148.99
Total CHOL/HDL Ratio: 5
Triglycerides: 198 mg/dL — ABNORMAL HIGH (ref 0.0–149.0)
VLDL: 39.6 mg/dL (ref 0.0–40.0)

## 2018-03-10 NOTE — Assessment & Plan Note (Signed)
CBC

## 2018-03-10 NOTE — Assessment & Plan Note (Signed)
Lovaza 

## 2018-03-10 NOTE — Assessment & Plan Note (Signed)
BP Readings from Last 3 Encounters:  03/10/18 124/80  08/13/17 122/84  01/25/17 130/70

## 2018-03-10 NOTE — Patient Instructions (Signed)
Vascepa    

## 2018-03-10 NOTE — Progress Notes (Signed)
Subjective:  Patient ID: [name], male    DOB: Mar 01, 1971  Age: 47 y.o. MRN: [id number]  CC: No chief complaint on file.   HPI [name] presents for occ HAs 1-2 per month. No n/v F/u dyslipidemia - on Lovasa  Outpatient Medications Prior to Visit  Medication Sig Dispense Refill  . cholecalciferol (VITAMIN D) 1000 UNITS tablet Take 1,000 Units by mouth daily.      . clotrimazole-betamethasone (LOTRISONE) cream APPLY TO AFFECTED AREA TWICE A DAY 30 g 1  . omega-3 acid ethyl esters (LOVAZA) 1 g capsule TAKE 2 CAPSULES (2 G TOTAL) BY MOUTH 2 (TWO) TIMES DAILY. 360 capsule 11  . Icosapent Ethyl (VASCEPA) 1 g CAPS Take 2 capsules by mouth 2 (two) times daily. (Patient not taking: Reported on 03/10/2018) 120 capsule 11   No facility-administered medications prior to visit.     ROS Review of Systems  Constitutional: Negative for appetite change, fatigue and unexpected weight change.  HENT: Negative for congestion, nosebleeds, sneezing, sore throat and trouble swallowing.   Eyes: Negative for itching and visual disturbance.  Respiratory: Negative for cough.   Cardiovascular: Negative for chest pain, palpitations and leg swelling.  Gastrointestinal: Negative for abdominal distention, blood in stool, diarrhea and nausea.  Genitourinary: Negative for frequency and hematuria.  Musculoskeletal: Negative for back pain, gait problem, joint swelling and neck pain.  Skin: Negative for rash.  Neurological: Positive for headaches. Negative for dizziness, tremors, speech difficulty and weakness.  Psychiatric/Behavioral: Negative for agitation, dysphoric mood and sleep disturbance. The patient is not nervous/anxious.     Objective:  BP 124/80 (BP Location: Left Arm, Patient Position: Sitting, Cuff Size: Large)   Pulse 77   Temp 98.4 F (36.9 C) (Oral)   Ht 5\' 3"  (1.6 m)   Wt 179 lb (81.2 kg)   SpO2 98%   BMI 31.71 kg/m   BP Readings from Last 3 Encounters:  03/10/18 124/80    08/13/17 122/84  01/25/17 130/70    Wt Readings from Last 3 Encounters:  03/10/18 179 lb (81.2 kg)  08/13/17 171 lb (77.6 kg)  01/25/17 171 lb 4 oz (77.7 kg)    Physical Exam  Constitutional: He is oriented to person, place, and time. He appears well-developed. No distress.  NAD  HENT:  Mouth/Throat: Oropharynx is clear and moist.  Eyes: Pupils are equal, round, and reactive to light. Conjunctivae are normal.  Neck: Normal range of motion. No JVD present. No thyromegaly present.  Cardiovascular: Normal rate, regular rhythm, normal heart sounds and intact distal pulses. Exam reveals no gallop and no friction rub.  No murmur heard. Pulmonary/Chest: Effort normal and breath sounds normal. No respiratory distress. He has no wheezes. He has no rales. He exhibits no tenderness.  Abdominal: Soft. Bowel sounds are normal. He exhibits no distension and no mass. There is no tenderness. There is no rebound and no guarding.  Musculoskeletal: Normal range of motion. He exhibits no edema or tenderness.  Lymphadenopathy:    He has no cervical adenopathy.  Neurological: He is alert and oriented to person, place, and time. He has normal reflexes. No cranial nerve deficit. He exhibits normal muscle tone. He displays a negative Romberg sign. Coordination and gait normal.  Skin: Skin is warm and dry. No rash noted.  Psychiatric: He has a normal mood and affect. His behavior is normal. Judgment and thought content normal.    Lab Results  Component Value Date   WBC 6.2 08/13/2017   HGB  16.0 08/13/2017   HCT 45.3 08/13/2017   PLT 279.0 08/13/2017   GLUCOSE 92 08/13/2017   CHOL 203 (H) 08/13/2017   TRIG 240.0 (H) 08/13/2017   HDL 32.30 (L) 08/13/2017   LDLDIRECT 115.0 08/13/2017   LDLCALC 118 (H) 01/25/2017   ALT 25 08/13/2017   AST 18 08/13/2017   NA 138 08/13/2017   K 4.0 08/13/2017   CL 102 08/13/2017   CREATININE 0.91 08/13/2017   BUN 10 08/13/2017   CO2 29 08/13/2017   TSH 1.21  08/13/2017   PSA 2.25 08/13/2017   INR 1.1 (H) 03/06/2016    No results found.  Assessment & Plan:   There are no diagnoses linked to this encounter. I have discontinued Williard Deland's Icosapent Ethyl. I am also having him maintain his cholecalciferol, omega-3 acid ethyl esters, and clotrimazole-betamethasone.  No orders of the defined types were placed in this encounter.    Follow-up: No follow-ups on file.  Walker Kehr, MD

## 2018-03-27 DIAGNOSIS — H33311 Horseshoe tear of retina without detachment, right eye: Secondary | ICD-10-CM | POA: Diagnosis not present

## 2018-03-27 DIAGNOSIS — H524 Presbyopia: Secondary | ICD-10-CM | POA: Diagnosis not present

## 2018-03-27 DIAGNOSIS — H52223 Regular astigmatism, bilateral: Secondary | ICD-10-CM | POA: Diagnosis not present

## 2018-03-27 DIAGNOSIS — H5213 Myopia, bilateral: Secondary | ICD-10-CM | POA: Diagnosis not present

## 2018-05-20 ENCOUNTER — Ambulatory Visit: Payer: BLUE CROSS/BLUE SHIELD | Admitting: Family

## 2018-05-20 ENCOUNTER — Encounter: Payer: Self-pay | Admitting: Family

## 2018-05-20 VITALS — BP 126/80 | HR 81 | Temp 98.2°F | Ht 63.0 in | Wt 178.1 lb

## 2018-05-20 DIAGNOSIS — M25552 Pain in left hip: Secondary | ICD-10-CM | POA: Diagnosis not present

## 2018-05-20 DIAGNOSIS — G8929 Other chronic pain: Secondary | ICD-10-CM | POA: Diagnosis not present

## 2018-05-20 NOTE — Progress Notes (Signed)
[name] is a 47 y.o. male with the following history as recorded in EpicCare:  Patient Active Problem List   Diagnosis Date Noted  . Elevated hemoglobin ([place]) 01/25/2017  . Hematochezia 03/06/2016  . Elevated BP without diagnosis of hypertension 03/06/2016  . Actinic keratosis 10/10/2014  . Cough 06/30/2014  . Well adult exam 09/04/2012  . Tinea pedis 09/01/2012  . Neoplasm of uncertain behavior of skin 08/01/2011  . Asthma with exacerbation 06/06/2010  . DYSPNEA 06/06/2010  . ALLERGIC RHINITIS 11/24/2009  . Dyslipidemia 11/22/2009  . SKIN RASH 11/22/2009  . GERD 01/03/2009  . FATTY LIVER DISEASE 04/01/2008  . LIVER FUNCTION TESTS, ABNORMAL, HX OF 10/07/2007    Current Outpatient Medications  Medication Sig Dispense Refill  . Cholecalciferol (VITAMIN D-1000 MAX ST) 1000 units tablet Take by mouth.    . omega-3 acid ethyl esters (LOVAZA) 1 g capsule TAKE 2 CAPSULES (2 G TOTAL) BY MOUTH 2 (TWO) TIMES DAILY. 360 capsule 11   No current facility-administered medications for this visit.     Allergies: Patient has no known allergies.  Past Medical History:  Diagnosis Date  . Allergy   . Asthmatic bronchitis    Due to Latex paint  . GERD (gastroesophageal reflux disease)   . History of liver disease    Fatty Liver disease  . Hyperlipidemia     Past Surgical History:  Procedure Laterality Date  . left  leg surgery     30 years ago  . Left hip surgery     30 years ago    Family History  Problem Relation Age of Onset  . Cancer Mother 12       cervical ca  . Glaucoma Father   . Colon cancer Neg Hx   . Esophageal cancer Neg Hx   . Rectal cancer Neg Hx   . Stomach cancer Neg Hx     Social History   Tobacco Use  . Smoking status: Never Smoker  . Smokeless tobacco: Never Used  Substance Use Topics  . Alcohol use: Yes    Comment: occasional wine use    Subjective:  Patient presents with concerns for chronic hip issues; had surgery in 1987 due to pain/ history  of broken left leg (broken in 3 places) ; had updated X-rays done in [place] on 05/06/2018 and was told he needed to see a specialist due to "bone density changes " and possible new bony formation;    Objective:  Vitals:   05/20/18 0838  BP: 126/80  Pulse: 81  Temp: 98.2 F (36.8 C)  TempSrc: Oral  SpO2: 96%  Weight: 178 lb 1.9 oz (80.8 kg)  Height: 5\' 3"  (1.6 m)    General: Well developed, well nourished, in no acute distress  Skin : Warm and dry.  Head: Normocephalic and atraumatic  Lungs: Respirations unlabored; clear to auscultation bilaterally without wheeze, rales, rhonchi  Musculoskeletal: No deformities; no active joint inflammation  Extremities: No edema, cyanosis, clubbing  Vessels: Symmetric bilaterally  Neurologic: Alert and oriented; speech intact; face symmetrical; moves all extremities well; CNII-XII intact without focal deficit  Assessment:  1. Chronic left hip pain     Plan:  Reviewed recent hip x-rays in patient- he brings to office for review. Changes are noted between left and right hip; refer to orthopedist as requested; follow-up as directed.    No follow-ups on file.  Orders Placed This Encounter  Procedures  . Ambulatory referral to Orthopedic Surgery    Referral Priority:  Routine    Referral Type:   Surgical    Referral Reason:   Specialty Services Required    Requested Specialty:   Orthopedic Surgery    Number of Visits Requested:   1    Requested Prescriptions    No prescriptions requested or ordered in this encounter

## 2018-06-16 ENCOUNTER — Encounter (INDEPENDENT_AMBULATORY_CARE_PROVIDER_SITE_OTHER): Payer: Self-pay | Admitting: Orthopaedic Surgery

## 2018-06-16 ENCOUNTER — Ambulatory Visit (INDEPENDENT_AMBULATORY_CARE_PROVIDER_SITE_OTHER): Payer: BLUE CROSS/BLUE SHIELD | Admitting: Orthopaedic Surgery

## 2018-06-16 VITALS — BP 142/97 | HR 78 | Ht 62.0 in | Wt 175.0 lb

## 2018-06-16 DIAGNOSIS — M545 Low back pain: Secondary | ICD-10-CM

## 2018-06-16 DIAGNOSIS — G8929 Other chronic pain: Secondary | ICD-10-CM

## 2018-06-16 DIAGNOSIS — M25552 Pain in left hip: Secondary | ICD-10-CM | POA: Diagnosis not present

## 2018-06-16 NOTE — Progress Notes (Signed)
Office Visit Note   Patient: [name]           Date of Birth: 1971-07-19           MRN: [id number] Visit Date: 06/16/2018              Requested by: Marrian Salvage, [place], [place] [address] PCP: Plotnikov, Evie Lacks, MD   Assessment & Plan: Visit Diagnoses:  1. Pain of left hip joint   2. Chronic midline low back pain without sciatica     Plan: Minimally symptomatic left hip osteoarthritis.  No treatment necessary at this point.  Does have chronic low back pain with evidence of scoliosis.  We will try a course of physical therapy.  Has history of headaches that I do not think are related to his back or his hip.  Could have some headaches related to his cervical spine.  Hopefully physical therapy will help. He will follow-up with his primary care physician for further evaluation of his headaches. Follow-Up Instructions: Return if symptoms worsen or fail to improve.   Orders:  No orders of the defined types were placed in this encounter.  No orders of the defined types were placed in this encounter.     Procedures: No procedures performed   Clinical Data: No additional findings.   Subjective: Chief Complaint  Patient presents with  . New Patient (Initial Visit)    L HIP PAIN FOR 30 YRS. HAD SURGERY ON IT 30 YRS AGO STILL NO BETTER GETTING WORSE  47 year old gentleman originally from [place] is been experiencing headaches recently is concerned that they may be related to either his back or his left hip.  His left hip history is significant at the underwent a subtrochanteric osteotomy of his left hip while living in the [place] many years ago.  He has old films that accompanied him in which I have reviewed.  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  Osteotomy was performed.  Fixation device was subsequently removed but still with a broken screw within he also has developed some mild arthritis not having much pain problem.  He also  has had films of his lumbar spine which are reviewed revealing a mild disc curvature of the lumbar spine to the left in the thoracic spine transitory curve to right.  There are also some films of the cervical spine which reveals some mild degenerative changes and some what appears to be incomplete fusion of the posterior elements.  He does not experience any upper extremity lower extremity radicular pain.  Does not take any medicines.  HPI  Review of Systems  Constitutional: Negative for fatigue and fever.  HENT: Negative for ear pain.   Eyes: Negative for pain.  Respiratory: Negative for cough and shortness of breath.   Cardiovascular: Negative for leg swelling.  Gastrointestinal: Negative for constipation and diarrhea.  Genitourinary: Negative for difficulty urinating.  Musculoskeletal: Negative for back pain and neck pain.  Skin: Negative for rash.  Allergic/Immunologic: Negative for food allergies.  Neurological: Negative for weakness and numbness.  Hematological: Does not bruise/bleed easily.  Psychiatric/Behavioral: Negative for sleep disturbance.     Objective: Vital Signs: BP (!) 142/97 (BP Location: Right Arm, Patient Position: Sitting, Cuff Size: Normal)   Pulse 78   Ht 5\' 2"  (1.575 m)   Wt 175 lb (79.4 kg)   BMI 32.01 kg/m   Physical Exam  Constitutional: He is oriented to person, place, and time. He  appears well-developed and well-nourished.  HENT:  Mouth/Throat: Oropharynx is clear and moist.  Eyes: Pupils are equal, round, and reactive to light. EOM are normal.  Pulmonary/Chest: Effort normal.  Neurological: He is alert and oriented to person, place, and time.  Skin: Skin is warm and dry.  Psychiatric: He has a normal mood and affect. His behavior is normal.    Ortho Exam awake alert and oriented x3.  Comfortable sitting.  Language  is a barrier as the patient is originally from the [place].  His English is somewhat limited.  No shortness of breath or chest pain.   Walks without a limp.  Painless range of motion of both hips with internal/external rotation.  Slightly shorter on the left leg compared to the right possibly a centimeter.  Neurologically appears to be intact.  Painless percussion of the lumbar spine.  On the forward bending he appeared to have slight curvature to the left in the lumbar spine and right of the thoracic spine.  Straight leg raise negative.  Specialty Comments:  No specialty comments available.  Imaging: No results found.   PMFS History: Patient Active Problem List   Diagnosis Date Noted  . Elevated hemoglobin ([place]) 01/25/2017  . Hematochezia 03/06/2016  . Elevated BP without diagnosis of hypertension 03/06/2016  . Actinic keratosis 10/10/2014  . Cough 06/30/2014  . Well adult exam 09/04/2012  . Tinea pedis 09/01/2012  . Neoplasm of uncertain behavior of skin 08/01/2011  . Asthma with exacerbation 06/06/2010  . DYSPNEA 06/06/2010  . ALLERGIC RHINITIS 11/24/2009  . Dyslipidemia 11/22/2009  . SKIN RASH 11/22/2009  . GERD 01/03/2009  . FATTY LIVER DISEASE 04/01/2008  . LIVER FUNCTION TESTS, ABNORMAL, HX OF 10/07/2007   Past Medical History:  Diagnosis Date  . Allergy   . Asthmatic bronchitis    Due to Latex paint  . GERD (gastroesophageal reflux disease)   . History of liver disease    Fatty Liver disease  . Hyperlipidemia     Family History  Problem Relation Age of Onset  . Cancer Mother 83       cervical ca  . Glaucoma Father   . Colon cancer Neg Hx   . Esophageal cancer Neg Hx   . Rectal cancer Neg Hx   . Stomach cancer Neg Hx     Past Surgical History:  Procedure Laterality Date  . left  leg surgery     30 years ago  . Left hip surgery     30 years ago   Social History   Occupational History  . Not on file  Tobacco Use  . Smoking status: Never Smoker  . Smokeless tobacco: Never Used  Substance and Sexual Activity  . Alcohol use: Yes    Comment: occasional wine use  . Drug use: No  .  Sexual activity: Yes

## 2018-06-30 ENCOUNTER — Other Ambulatory Visit: Payer: Self-pay

## 2018-06-30 ENCOUNTER — Encounter: Payer: Self-pay | Admitting: Physical Therapy

## 2018-06-30 ENCOUNTER — Ambulatory Visit: Payer: BLUE CROSS/BLUE SHIELD | Attending: Orthopaedic Surgery | Admitting: Physical Therapy

## 2018-06-30 DIAGNOSIS — M545 Low back pain, unspecified: Secondary | ICD-10-CM

## 2018-06-30 DIAGNOSIS — G8929 Other chronic pain: Secondary | ICD-10-CM | POA: Diagnosis not present

## 2018-06-30 NOTE — Therapy (Signed)
[facility] [address], [place], [place], [address] Phone: [phone]   Fax:  [phone]  Physical Therapy Evaluation  Patient Details  Name: [name] MRN: [id number] Date of Birth: 26-Jan-1971 Referring Provider: Dr. Joni Fears   Encounter Date: 06/30/2018  PT End of Session - 06/30/18 0851    Visit Number  1    Date for PT Re-Evaluation  08/25/18    Authorization Type  BCBS    PT Start Time  0800    PT Stop Time  0840    PT Time Calculation (min)  40 min    Activity Tolerance  Patient tolerated treatment well    Behavior During Therapy  [facility] for tasks assessed/performed       Past Medical History:  Diagnosis Date  . Allergy   . Asthmatic bronchitis    Due to Latex paint  . GERD (gastroesophageal reflux disease)   . History of liver disease    Fatty Liver disease  . Hyperlipidemia     Past Surgical History:  Procedure Laterality Date  . left  leg surgery     30 years ago  . Left hip surgery     30 years ago    There were no vitals filed for this visit.   Subjective Assessment - 06/30/18 0805    Subjective  Patient reports back pain with sudden onset that started several years ago and is intermittent. No numbness or tingling down the legs.     Patient Stated Goals  reduce back pain    Currently in Pain?  Yes    Pain Score  2     Pain Location  Back    Pain Orientation  Lower    Pain Descriptors / Indicators  Spasm    Pain Type  Chronic pain    Pain Onset  More than a month ago    Pain Frequency  Intermittent    Aggravating Factors   sitting, twisting    Pain Relieving Factors  rest    Multiple Pain Sites  No         [facility] PT Assessment - 06/30/18 0001      Assessment   Medical Diagnosis  M54.5, G89.29 chronic midlinge low back pain without sciatica    Referring Provider  Dr. Joni Fears    Onset Date/Surgical Date  06/30/14    Prior Therapy  none      Precautions   Precautions  None      Restrictions   Weight Bearing Restrictions  No      Balance Screen   Has the patient fallen in the past 6 months  No    Has the patient had a decrease in activity level because of a fear of falling?   No    Is the patient reluctant to leave their home because of a fear of falling?   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    Leisure  gym, swimming, walk 45 minutes      Cognition   Overall Cognitive Status  Within Functional Limits for tasks assessed      Observation/Other Assessments   Focus on Therapeutic Outcomes (FOTO)   17% limitation; goal is 15% limitation  Posture/Postural Control   Posture/Postural Control  No significant limitations    Posture Comments  left leg is 1.5 cm shorter      ROM / Strength   AROM / PROM / Strength  AROM;PROM;Strength      AROM   Overall AROM Comments  limited by 25% for all directions with decreased movement in T10 -L2      Strength   Overall Strength Comments  lumbar strength is 4/5      Palpation   Spinal mobility  decreased mobility of T10-L2    SI assessment   ASIS equal                Objective measurements completed on examination: See above findings.      [place] [place] Adult PT Treatment/Exercise - 06/30/18 0001      Exercises   Exercises  Lumbar      Lumbar Exercises: Quadruped   Madcat/Old Horse  10 reps    Madcat/Old Horse Limitations  tactile cues to move spine correctly    Straight Leg Raise  5 reps each side    Straight Leg Raises Limitations  VC to not twist the spine and engae the core    Opposite Arm/Leg Raise  Right arm/Left leg;Left arm/Right leg;5 reps;Limitations    Opposite Arm/Leg Raise Limitations  VC to not twist the spine and engage the core      Manual Therapy   Manual Therapy  Joint mobilization;Soft tissue  mobilization    Joint Mobilization  T5-L3 rotational mobilization grade 3             PT Education - 06/30/18 0851    Education Details  information on dry needling    Person(s) Educated  Patient    Methods  Explanation;Handout    Comprehension  Verbalized understanding       PT Short Term Goals - 06/30/18 0857      PT SHORT TERM GOAL #1   Title  independent with initial HEP    Time  4    Period  Weeks    Status  New    Target Date  07/28/18      PT SHORT TERM GOAL #2   Title  back pain decreased >/= 25%    Time  4    Period  Weeks    Status  New    Target Date  07/28/18        PT Long Term Goals - 06/30/18 0858      PT LONG TERM GOAL #1   Title  independent with HEP and understand how to progress himself    Time  8    Period  Weeks    Status  New    Target Date  08/25/18      PT LONG TERM GOAL #2   Title  full lumbar ROM with increased mobility at T10-L2 area to reduce lumbar pain >/= 75%    Time  8    Period  Weeks    Status  New    Target Date  08/25/18      PT LONG TERM GOAL #3   Title  understand correct body mechanics with sitting at the computer and daily tasks to decrease strain on lumbar region    Time  8    Period  Weeks    Status  New    Target Date  08/25/18      PT LONG TERM GOAL #4   Title  FOTO  score </= 15% limitation    Time  8    Period  Weeks    Status  New    Target Date  08/25/18             Plan - 06/30/18 0851    Clinical Impression Statement  Patient is a 47 year old male with chronic back pain with sudden onset. Patient reports 2/10 with twisting and sitting.  Patient lumbar ROM is limited by 25% for all direction particulary at the T10-L2 with tissue tightness too. Patient has weakness in the core while performing quadruped with arm and leg movements.  Patient has no radicular symptoms.  Patient will benefit from skilled therapy to improve mobility and strength of the core to reduce pain with activity.     History  and Personal Factors relevant to plan of care:  Scoliosis    Clinical Presentation  Stable    Clinical Presentation due to:  stable condition    Clinical Decision Making  Low    Rehab Potential  Excellent    Clinical Impairments Affecting Rehab Potential  None    PT Frequency  2x / week    PT Duration  8 weeks    PT Treatment/Interventions  Cryotherapy;Electrical Stimulation;Moist Heat;Traction;Ultrasound;Therapeutic activities;Therapeutic exercise;Patient/family education;Neuromuscular re-education;Manual techniques;Dry needling    PT Next Visit Plan  dry needling to T8-L2 multifidi; joint mobilization to same; core strength; body mechanics with daily activities    Consulted and Agree with Plan of Care  Patient       Patient will benefit from skilled therapeutic intervention in order to improve the following deficits and impairments:  Pain, Increased fascial restricitons, Decreased mobility, Decreased range of motion, Decreased strength, Increased muscle spasms  Visit Diagnosis: Chronic midline low back pain without sciatica     Problem List Patient Active Problem List   Diagnosis Date Noted  . Elevated hemoglobin ([place]) 01/25/2017  . Hematochezia 03/06/2016  . Elevated BP without diagnosis of hypertension 03/06/2016  . Actinic keratosis 10/10/2014  . Cough 06/30/2014  . Well adult exam 09/04/2012  . Tinea pedis 09/01/2012  . Neoplasm of uncertain behavior of skin 08/01/2011  . Asthma with exacerbation 06/06/2010  . DYSPNEA 06/06/2010  . ALLERGIC RHINITIS 11/24/2009  . Dyslipidemia 11/22/2009  . SKIN RASH 11/22/2009  . GERD 01/03/2009  . FATTY LIVER DISEASE 04/01/2008  . LIVER FUNCTION TESTS, ABNORMAL, HX OF 10/07/2007    Earlie Counts, PT 06/30/18 9:00 AM   [facility] [address], [place], [place], [address] Phone: [phone]   Fax:  [phone]  Name: [name] MRN: [id number] Date of Birth:  26-Jan-1971

## 2018-06-30 NOTE — Patient Instructions (Signed)

## 2018-07-03 ENCOUNTER — Ambulatory Visit: Payer: BLUE CROSS/BLUE SHIELD

## 2018-07-03 DIAGNOSIS — M545 Low back pain, unspecified: Secondary | ICD-10-CM

## 2018-07-03 DIAGNOSIS — G8929 Other chronic pain: Secondary | ICD-10-CM

## 2018-07-03 NOTE — Therapy (Signed)
[facility] [address], [place], [place], [address] Phone: [phone]   Fax:  [phone]  Physical Therapy Treatment  Patient Details  Name: [name] MRN: [id number] Date of Birth: 01/30/1971 Referring Provider: Dr. Joni Fears   Encounter Date: 07/03/2018  PT End of Session - 07/03/18 0844    Visit Number  2    Date for PT Re-Evaluation  08/25/18    Authorization Type  BCBS    PT Start Time  0803    PT Stop Time  0842    PT Time Calculation (min)  39 min    Activity Tolerance  Patient tolerated treatment well    Behavior During Therapy  [facility] for tasks assessed/performed       Past Medical History:  Diagnosis Date  . Allergy   . Asthmatic bronchitis    Due to Latex paint  . GERD (gastroesophageal reflux disease)   . History of liver disease    Fatty Liver disease  . Hyperlipidemia     Past Surgical History:  Procedure Laterality Date  . left  leg surgery     30 years ago  . Left hip surgery     30 years ago    There were no vitals filed for this visit.  Subjective Assessment - 07/03/18 0805    Subjective  I have been doing my exercises.  I want to decide about dry needling next visit.      Currently in Pain?  No/denies                       [facility] Adult PT Treatment/Exercise - 07/03/18 0001      Lumbar Exercises: Stretches   Single Knee to Chest Stretch  3 reps;Left;Right;20 seconds    Lower Trunk Rotation  3 reps;20 seconds      Lumbar Exercises: Aerobic   UBE (Upper Arm Bike)  Level 2x 6 minutes (3/3) verbal cues for posture      Lumbar Exercises: Seated   Other Seated Lumbar Exercises  thoracolumbar rotation 3x20 seconds      Lumbar Exercises: Sidelying   Other Sidelying Lumbar Exercises  open book stretch x 10 bil      Lumbar Exercises: Quadruped   Madcat/Old Horse  10 reps    Madcat/Old Horse Limitations  tactile cues to move spine correctly    Straight  Leg Raise  5 reps each side    Straight Leg Raises Limitations  VC to not twist the spine and engae the core    Opposite Arm/Leg Raise  Right arm/Left leg;Left arm/Right leg;5 reps;Limitations    Opposite Arm/Leg Raise Limitations  VC to not twist the spine and engage the core      Manual Therapy   Manual Therapy  Joint mobilization;Soft tissue mobilization    Joint Mobilization  T5-L3 rotational mobilization grade 3             PT Education - 07/03/18 0815    Education Details   Access Code: [email or web address]     Person(s) Educated  Patient    Methods  Explanation;Handout;Demonstration    Comprehension  Verbalized understanding;Returned demonstration       PT Short Term Goals - 06/30/18 0857      PT SHORT TERM GOAL #1   Title  independent with initial HEP    Time  4    Period  Weeks    Status  New    Target  Date  07/28/18      PT SHORT TERM GOAL #2   Title  back pain decreased >/= 25%    Time  4    Period  Weeks    Status  New    Target Date  07/28/18        PT Long Term Goals - 06/30/18 0858      PT LONG TERM GOAL #1   Title  independent with HEP and understand how to progress himself    Time  8    Period  Weeks    Status  New    Target Date  08/25/18      PT LONG TERM GOAL #2   Title  full lumbar ROM with increased mobility at T10-L2 area to reduce lumbar pain >/= 75%    Time  8    Period  Weeks    Status  New    Target Date  08/25/18      PT LONG TERM GOAL #3   Title  understand correct body mechanics with sitting at the computer and daily tasks to decrease strain on lumbar region    Time  8    Period  Weeks    Status  New    Target Date  08/25/18      PT LONG TERM GOAL #4   Title  FOTO score </= 15% limitation    Time  8    Period  Weeks    Status  New    Target Date  08/25/18            Plan - 07/03/18 0806    Clinical Impression Statement  Today was follow-up session after evaluation.  Pt is able to perform all initial HEP without  difficulty.  Minor tactile and verbal cues required for technique with quadruped exercise.  Pt with significant limitaiton in segmental mobility in the thoracic and lumbar spine.  Pt will continue to benefit from skilled PT for thoracic and lumbar mobility/flexibility and core strength.     Rehab Potential  Excellent    PT Frequency  2x / week    PT Duration  8 weeks    PT Treatment/Interventions  Cryotherapy;Electrical Stimulation;Moist Heat;Traction;Ultrasound;Therapeutic activities;Therapeutic exercise;Patient/family education;Neuromuscular re-education;Manual techniques;Dry needling    PT Next Visit Plan  dry needling to T8-L2 multifid if pt agreesi; joint mobilization to same; core strength; body mechanics with daily activities    Recommended Other Services  initial certification is signed    Consulted and Agree with Plan of Care  Patient       Patient will benefit from skilled therapeutic intervention in order to improve the following deficits and impairments:  Pain, Increased fascial restricitons, Decreased mobility, Decreased range of motion, Decreased strength, Increased muscle spasms  Visit Diagnosis: Chronic midline low back pain without sciatica     Problem List Patient Active Problem List   Diagnosis Date Noted  . Elevated hemoglobin ([place]) 01/25/2017  . Hematochezia 03/06/2016  . Elevated BP without diagnosis of hypertension 03/06/2016  . Actinic keratosis 10/10/2014  . Cough 06/30/2014  . Well adult exam 09/04/2012  . Tinea pedis 09/01/2012  . Neoplasm of uncertain behavior of skin 08/01/2011  . Asthma with exacerbation 06/06/2010  . DYSPNEA 06/06/2010  . ALLERGIC RHINITIS 11/24/2009  . Dyslipidemia 11/22/2009  . SKIN RASH 11/22/2009  . GERD 01/03/2009  . FATTY LIVER DISEASE 04/01/2008  . LIVER FUNCTION TESTS, ABNORMAL, HX OF 10/07/2007    Jennfer Gassen 07/03/2018, 8:45 AM  [facility] [address] [place], [place], [address] Phone: [phone]   Fax:  [phone]  Name: [name] MRN: [id number] Date of Birth: 21-Nov-1971

## 2018-07-09 ENCOUNTER — Ambulatory Visit: Payer: BLUE CROSS/BLUE SHIELD

## 2018-07-09 DIAGNOSIS — M545 Low back pain, unspecified: Secondary | ICD-10-CM

## 2018-07-09 DIAGNOSIS — G8929 Other chronic pain: Secondary | ICD-10-CM | POA: Diagnosis not present

## 2018-07-09 NOTE — Patient Instructions (Addendum)
Cervico-Thoracic: Extension / Rotation (Sitting)    Reach across body with left arm and grasp back of chair. Gently look over right side shoulder. Hold __20__ seconds. Relax. Repeat _3___ times per set. Do ____ sets per session. Do __3__ sessions per day.  [email or web address].exer.us/980   Copyright  VHI. All rights reserved.  Access Code: [email or web address]  URL: [email or web address]  Date: 07/09/2018  Prepared by: Sigurd Sos   Exercises  Supine Shoulder Horizontal Abduction with Resistance - 10 reps - 2 sets - 2x daily - 7x weekly  Supine Bilateral Shoulder External Rotation with Resistance - 10 reps - 2 sets - 2x daily - 7x weekly  Supine PNF D2 Flexion with Resistance - 10 reps - 2 sets - 2x daily - 7x weekly

## 2018-07-09 NOTE — Therapy (Signed)
[facility] [address], [place], [place], [address] Phone: [phone]   Fax:  [phone]  Physical Therapy Treatment  Patient Details  Name: [name] MRN: [id number] Date of Birth: 05-08-1971 Referring Provider: Dr. Joni Fears   Encounter Date: 07/09/2018  [name] End of Session - 07/09/18 0925    Visit Number  3    Date for [name] Re-Evaluation  08/25/18    Authorization Type  BCBS    [name] Start Time  0847    [name] Stop Time  0926    [name] Time Calculation (min)  39 min    Activity Tolerance  Patient tolerated treatment well    Behavior During Therapy  [facility] for tasks assessed/performed       Past Medical History:  Diagnosis Date  . Allergy   . Asthmatic bronchitis    Due to Latex paint  . GERD (gastroesophageal reflux disease)   . History of liver disease    Fatty Liver disease  . Hyperlipidemia     Past Surgical History:  Procedure Laterality Date  . left  leg surgery     30 years ago  . Left hip surgery     30 years ago    There were no vitals filed for this visit.  Subjective Assessment - 07/09/18 0852    Subjective  I feel good.  I have been doing my exercises without pain.      Currently in Pain?  No/denies                       [facility] Adult [name] Treatment/Exercise - 07/09/18 0001      Lumbar Exercises: Stretches   Single Knee to Chest Stretch  3 reps;Left;Right;20 seconds    Lower Trunk Rotation  3 reps;20 seconds      Lumbar Exercises: Aerobic   UBE (Upper Arm Bike)  Level 2x 6 minutes (3/3) verbal cues for posture      Lumbar Exercises: Seated   Other Seated Lumbar Exercises  thoracolumbar rotation 3x20 seconds      Lumbar Exercises: Supine   Other Supine Lumbar Exercises  supine on foam roll: decompression x 3 minutes, green theraband horizontal abduction, D2 and ER bil 2x10 each with core activation      Lumbar Exercises: Sidelying   Other Sidelying Lumbar Exercises   open book stretch x 20 bil      Manual Therapy   Manual Therapy  Joint mobilization;Soft tissue mobilization    Joint Mobilization  T5-L3 PA mobilization grade 3             [name] Education - 07/09/18 0908    Education Details   Access Code: [email or web address] and seated thoracic rotation stretch    Person(s) Educated  Patient    Methods  Explanation;Demonstration;Handout    Comprehension  Verbalized understanding;Returned demonstration       [name] Short Term Goals - 07/09/18 0852      [name] SHORT TERM GOAL #1   Title  independent with initial HEP    Status  Achieved        [name] Long Term Goals - 06/30/18 6387      [name] LONG TERM GOAL #1   Title  independent with HEP and understand how to progress himself    Time  8    Period  Weeks    Status  New    Target Date  08/25/18      [name]  LONG TERM GOAL #2   Title  full lumbar ROM with increased mobility at T10-L2 area to reduce lumbar pain >/= 75%    Time  8    Period  Weeks    Status  New    Target Date  08/25/18      [name] LONG TERM GOAL #3   Title  understand correct body mechanics with sitting at the computer and daily tasks to decrease strain on lumbar region    Time  8    Period  Weeks    Status  New    Target Date  08/25/18      [name] LONG TERM GOAL #4   Title  FOTO score </= 15% limitation    Time  8    Period  Weeks    Status  New    Target Date  08/25/18            Plan - 07/09/18 0853    Clinical Impression Statement  [name] remains stiff and with limited segmental mobility in the thoracic and lumbar spine.  [name] is independent and compliant in HEP for flexibility and core strength.  [name] requires tactile and verbal cues to reduce substitution with exercise in the clinic today.  [name] added supine lumbar/thoracic stability exercises to HEP.  [name] will continue to benefit from skilled [name] for flexibility and core/postural strength exercises.      Rehab Potential  Excellent    [name] Frequency  2x / week    [name] Duration  8 weeks    [name]  Treatment/Interventions  Cryotherapy;Electrical Stimulation;Moist Heat;Traction;Ultrasound;Therapeutic activities;Therapeutic exercise;Patient/family education;Neuromuscular re-education;Manual techniques;Dry needling    [name] Next Visit Plan  core strength, flexibility, manual to improve segmental mobility.  Dry needling to thoracic multifidi next    [name] Home Exercise Plan  access code: [email or web address]    Consulted and Agree with Plan of Care  Patient       Patient will benefit from skilled therapeutic intervention in order to improve the following deficits and impairments:  Pain, Increased fascial restricitons, Decreased mobility, Decreased range of motion, Decreased strength, Increased muscle spasms  Visit Diagnosis: Chronic midline low back pain without sciatica     Problem List Patient Active Problem List   Diagnosis Date Noted  . Elevated hemoglobin ([place]) 01/25/2017  . Hematochezia 03/06/2016  . Elevated BP without diagnosis of hypertension 03/06/2016  . Actinic keratosis 10/10/2014  . Cough 06/30/2014  . Well adult exam 09/04/2012  . Tinea pedis 09/01/2012  . Neoplasm of uncertain behavior of skin 08/01/2011  . Asthma with exacerbation 06/06/2010  . DYSPNEA 06/06/2010  . ALLERGIC RHINITIS 11/24/2009  . Dyslipidemia 11/22/2009  . SKIN RASH 11/22/2009  . GERD 01/03/2009  . FATTY LIVER DISEASE 04/01/2008  . LIVER FUNCTION TESTS, ABNORMAL, HX OF 10/07/2007   [name], [name] 07/09/18 9:31 AM  [facility] [address] [place], [place], [address] Phone: [phone]   Fax:  [phone]  Name: [name] MRN: [id number] Date of Birth: April 13, 1971

## 2018-07-15 ENCOUNTER — Ambulatory Visit: Payer: BLUE CROSS/BLUE SHIELD

## 2018-07-15 DIAGNOSIS — G8929 Other chronic pain: Secondary | ICD-10-CM

## 2018-07-15 DIAGNOSIS — M545 Low back pain: Secondary | ICD-10-CM | POA: Diagnosis not present

## 2018-07-15 NOTE — Patient Instructions (Addendum)

## 2018-07-15 NOTE — Therapy (Signed)
[facility] [address] [place], [place], [address] Phone: [phone]   Fax:  [phone]  Physical Therapy Treatment  Patient Details  Name: [name] MRN: [id number] Date of Birth: Nov 02, 1971 Referring Provider: Dr. Joni Fears   Encounter Date: 07/15/2018  [name] End of Session - 07/15/18 1308    Visit Number  4    Date for [name] Re-Evaluation  08/25/18    Authorization Type  BCBS    [name] Start Time  1231 dry needling    [name] Stop Time  1306    [name] Time Calculation (min)  35 min    Activity Tolerance  Patient tolerated treatment well    Behavior During Therapy  [facility] for tasks assessed/performed       Past Medical History:  Diagnosis Date  . Allergy   . Asthmatic bronchitis    Due to Latex paint  . GERD (gastroesophageal reflux disease)   . History of liver disease    Fatty Liver disease  . Hyperlipidemia     Past Surgical History:  Procedure Laterality Date  . left  leg surgery     30 years ago  . Left hip surgery     30 years ago    There were no vitals filed for this visit.  Subjective Assessment - 07/15/18 1235    Subjective  I am stiff today.  I rode my bike and played tennis this weekend.      Currently in Pain?  Yes    Pain Score  2     Pain Location  Back    Pain Orientation  Mid;Lower    Pain Descriptors / Indicators  Tightness    Pain Type  Chronic pain    Pain Onset  More than a month ago    Pain Frequency  Intermittent    Aggravating Factors   after activity    Pain Relieving Factors  rest                       [facility] Adult [name] Treatment/Exercise - 07/15/18 0001      Lumbar Exercises: Stretches   Single Knee to Chest Stretch  3 reps;Left;Right;20 seconds    Lower Trunk Rotation  3 reps;20 seconds      Lumbar Exercises: Supine   Other Supine Lumbar Exercises  supine on foam roll: decompression x 3 minutes, green theraband horizontal abduction, D2 and ER bil 2x10  each with core activation      Lumbar Exercises: Sidelying   Other Sidelying Lumbar Exercises  open book stretch x 20 bil      Modalities   Modalities  --      Moist Heat Therapy   Number Minutes Moist Heat  --    Moist Heat Location  --      Manual Therapy   Manual Therapy  Joint mobilization;Soft tissue mobilization    Manual therapy comments  elongation and trigger point release to bil thoracolumbar paraspinals    Joint Mobilization  T5-L3 PA mobilization grade 3       Trigger Point Dry Needling - 07/15/18 1254    Consent Given?  Yes    Education Handout Provided  Yes    Muscles Treated Upper Body  -- multifidi T8-L3           [name] Education - 07/15/18 1235    Education Details  DN info    Person(s) Educated  Patient  Methods  Explanation;Demonstration;Handout    Comprehension  Verbalized understanding;Returned demonstration       [name] Short Term Goals - 07/09/18 7591      [name] SHORT TERM GOAL #1   Title  independent with initial HEP    Status  Achieved        [name] Long Term Goals - 06/30/18 0858      [name] LONG TERM GOAL #1   Title  independent with HEP and understand how to progress himself    Time  8    Period  Weeks    Status  New    Target Date  08/25/18      [name] LONG TERM GOAL #2   Title  full lumbar ROM with increased mobility at T10-L2 area to reduce lumbar pain >/= 75%    Time  8    Period  Weeks    Status  New    Target Date  08/25/18      [name] LONG TERM GOAL #3   Title  understand correct body mechanics with sitting at the computer and daily tasks to decrease strain on lumbar region    Time  8    Period  Weeks    Status  New    Target Date  08/25/18      [name] LONG TERM GOAL #4   Title  FOTO score </= 15% limitation    Time  8    Period  Weeks    Status  New    Target Date  08/25/18            Plan - 07/15/18 1257    Clinical Impression Statement  [name] reports 50% overall improvement in symptoms since the start of care.  [name] is  independent and compliant with HEP and demonstrated supine scapular strength exercises correctly.  [name] with reduced segmental mobility in the thoracic and lumbar spine and demonstrated improved mobility after dry needling today.  [name] will continue to benefit from skilled [name] for scapular strength, lumbar/thoracic mobility, and manual.     Rehab Potential  Excellent    [name] Frequency  2x / week    [name] Duration  8 weeks    [name] Treatment/Interventions  Cryotherapy;Electrical Stimulation;Moist Heat;Traction;Ultrasound;Therapeutic activities;Therapeutic exercise;Patient/family education;Neuromuscular re-education;Manual techniques;Dry needling    [name] Next Visit Plan  core strength, flexibility, manual to improve segmental mobility.  Assess response to dry needling.    [name] Home Exercise Plan  access code: [email or web address]    Consulted and Agree with Plan of Care  Patient       Patient will benefit from skilled therapeutic intervention in order to improve the following deficits and impairments:  Pain, Increased fascial restricitons, Decreased mobility, Decreased range of motion, Decreased strength, Increased muscle spasms  Visit Diagnosis: Chronic midline low back pain without sciatica     Problem List Patient Active Problem List   Diagnosis Date Noted  . Elevated hemoglobin ([place]) 01/25/2017  . Hematochezia 03/06/2016  . Elevated BP without diagnosis of hypertension 03/06/2016  . Actinic keratosis 10/10/2014  . Cough 06/30/2014  . Well adult exam 09/04/2012  . Tinea pedis 09/01/2012  . Neoplasm of uncertain behavior of skin 08/01/2011  . Asthma with exacerbation 06/06/2010  . DYSPNEA 06/06/2010  . ALLERGIC RHINITIS 11/24/2009  . Dyslipidemia 11/22/2009  . SKIN RASH 11/22/2009  . GERD 01/03/2009  . FATTY LIVER DISEASE 04/01/2008  . LIVER FUNCTION TESTS, ABNORMAL, HX OF 10/07/2007     [name], [name] 07/15/18 1:10 PM  [facility] [address], [place], [place], [address] Phone: [phone]   Fax:  [phone]  Name: [name] MRN: [id number] Date of Birth: [birth year]

## 2018-07-17 ENCOUNTER — Ambulatory Visit: Payer: BLUE CROSS/BLUE SHIELD | Attending: Orthopaedic Surgery

## 2018-07-17 DIAGNOSIS — M545 Low back pain: Secondary | ICD-10-CM | POA: Insufficient documentation

## 2018-07-17 DIAGNOSIS — G8929 Other chronic pain: Secondary | ICD-10-CM | POA: Insufficient documentation

## 2018-07-17 NOTE — Therapy (Signed)
[facility] [address] [place], [place], [address] Phone: [phone]   Fax:  [phone]  Physical Therapy Treatment  Patient Details  Name: [name] MRN: [id number] Date of Birth: [birth year] Referring Provider: Dr. Joni Fears   Encounter Date: 07/17/2018  PT End of Session - 07/17/18 1306    Visit Number  5    Date for PT Re-Evaluation  08/25/18    Authorization Type  BCBS    PT Start Time  1232    PT Stop Time  1305    PT Time Calculation (min)  33 min    Activity Tolerance  Patient tolerated treatment well    Behavior During Therapy  [facility] for tasks assessed/performed       Past Medical History:  Diagnosis Date  . Allergy   . Asthmatic bronchitis    Due to Latex paint  . GERD (gastroesophageal reflux disease)   . History of liver disease    Fatty Liver disease  . Hyperlipidemia     Past Surgical History:  Procedure Laterality Date  . left  leg surgery     30 years ago  . Left hip surgery     30 years ago    There were no vitals filed for this visit.  Subjective Assessment - 07/17/18 1235    Subjective  The needling was very nice.  I felt better after the treatment.      Patient Stated Goals  reduce back pain    Currently in Pain?  Yes    Pain Score  1     Pain Location  Back    Pain Descriptors / Indicators  Tightness                       [facility] Adult PT Treatment/Exercise - 07/17/18 0001      Exercises   Exercises  Shoulder      Lumbar Exercises: Stretches   Single Knee to Chest Stretch  3 reps;Left;Right;20 seconds    Lower Trunk Rotation  3 reps;20 seconds      Lumbar Exercises: Aerobic   UBE (Upper Arm Bike)  Level 2x 6 minutes (3/3) verbal cues for posture      Lumbar Exercises: Standing   Row  Strengthening;Both;20 reps;Theraband    Theraband Level (Row)  Level 3 (Green)    Shoulder Extension  Strengthening;Both;Theraband    Theraband Level  (Shoulder Extension)  Level 3 (Green)      Lumbar Exercises: Supine   Other Supine Lumbar Exercises  supine on foam roll: decompression x 3 minutes, green theraband horizontal abduction, D2 and ER bil 2x10 each with core activation      Lumbar Exercises: Sidelying   Other Sidelying Lumbar Exercises  open book stretch x 20 bil      Shoulder Exercises: Seated   Horizontal ABduction  Strengthening;Both;20 reps;Theraband    Theraband Level (Shoulder Horizontal ABduction)  Level 3 (Green)    Flexion  Strengthening;Both;20 reps;Weights    Flexion Weight (lbs)  1 incresed to 2# on 2nd set    Abduction  Strengthening;Both;20 reps;Weights    ABduction Weight (lbs)  1 increased to 2# on 2nd set    Diagonals  Strengthening;Both;20 reps;Weights    Diagonals Weight (lbs)  1 increased to 2# on 2nd set      Manual Therapy   Manual Therapy  Joint mobilization;Soft tissue mobilization    Joint Mobilization  T5-L3 PA mobilization grade 3  PT Short Term Goals - 07/09/18 1610      PT SHORT TERM GOAL #1   Title  independent with initial HEP    Status  Achieved        PT Long Term Goals - 06/30/18 0858      PT LONG TERM GOAL #1   Title  independent with HEP and understand how to progress himself    Time  8    Period  Weeks    Status  New    Target Date  08/25/18      PT LONG TERM GOAL #2   Title  full lumbar ROM with increased mobility at T10-L2 area to reduce lumbar pain >/= 75%    Time  8    Period  Weeks    Status  New    Target Date  08/25/18      PT LONG TERM GOAL #3   Title  understand correct body mechanics with sitting at the computer and daily tasks to decrease strain on lumbar region    Time  8    Period  Weeks    Status  New    Target Date  08/25/18      PT LONG TERM GOAL #4   Title  FOTO score </= 15% limitation    Time  8    Period  Weeks    Status  New    Target Date  08/25/18            Plan - 07/17/18 1238    Clinical Impression  Statement  Pt with reduced stiffness after dry needling last session.  Pt is performing postural corrections with desk work and is performing postural strength exercises.  Pt with continued thoracic mobility limitations.  Pt required postural cueing with seated exercise today to reduce scapular elevation.  Pt will continue to benefit from skilled PT for postural strength, flexibility and dry needling to improve mobility and reduce stiffness/pain.      Rehab Potential  Excellent    PT Frequency  2x / week    PT Duration  8 weeks    PT Treatment/Interventions  Cryotherapy;Electrical Stimulation;Moist Heat;Traction;Ultrasound;Therapeutic activities;Therapeutic exercise;Patient/family education;Neuromuscular re-education;Manual techniques;Dry needling    PT Next Visit Plan  core strength, flexibility, manual to improve segmental mobility.  Dry needling to thoracic spine.     PT Home Exercise Plan  access code: [email or web address]    Consulted and Agree with Plan of Care  Patient       Patient will benefit from skilled therapeutic intervention in order to improve the following deficits and impairments:  Pain, Increased fascial restricitons, Decreased mobility, Decreased range of motion, Decreased strength, Increased muscle spasms  Visit Diagnosis: Chronic midline low back pain without sciatica     Problem List Patient Active Problem List   Diagnosis Date Noted  . Elevated hemoglobin ([place]) 01/25/2017  . Hematochezia 03/06/2016  . Elevated BP without diagnosis of hypertension 03/06/2016  . Actinic keratosis 10/10/2014  . Cough 06/30/2014  . Well adult exam 09/04/2012  . Tinea pedis 09/01/2012  . Neoplasm of uncertain behavior of skin 08/01/2011  . Asthma with exacerbation 06/06/2010  . DYSPNEA 06/06/2010  . ALLERGIC RHINITIS 11/24/2009  . Dyslipidemia 11/22/2009  . SKIN RASH 11/22/2009  . GERD 01/03/2009  . FATTY LIVER DISEASE 04/01/2008  . LIVER FUNCTION TESTS, ABNORMAL, HX OF 10/07/2007     Sigurd Sos, PT 07/17/18 1:08 PM  [facility] [address]  [place], [place], [address] Phone: [phone]   Fax:  [phone]  Name: [name] MRN: [id number] Date of Birth: 04-Oct-1971

## 2018-07-21 ENCOUNTER — Ambulatory Visit: Payer: BLUE CROSS/BLUE SHIELD

## 2018-07-21 DIAGNOSIS — M545 Low back pain, unspecified: Secondary | ICD-10-CM

## 2018-07-21 DIAGNOSIS — G8929 Other chronic pain: Secondary | ICD-10-CM

## 2018-07-21 NOTE — Therapy (Addendum)
[facility] [address] [place], [place], [address] Phone: [phone]   Fax:  [phone]  Physical Therapy Treatment  Patient Details  Name: [name] MRN: [id number] Date of Birth: [birth year] Referring Provider: Dr. Joni Fears   Encounter Date: 07/21/2018  PT End of Session - 07/21/18 1319    Visit Number  6    Date for PT Re-Evaluation  08/25/18    Authorization Type  BCBS    PT Start Time  1247    PT Stop Time  1318    PT Time Calculation (min)  31 min    Activity Tolerance  Patient tolerated treatment well    Behavior During Therapy  [facility] for tasks assessed/performed       Past Medical History:  Diagnosis Date  . Allergy   . Asthmatic bronchitis    Due to Latex paint  . GERD (gastroesophageal reflux disease)   . History of liver disease    Fatty Liver disease  . Hyperlipidemia     Past Surgical History:  Procedure Laterality Date  . left  leg surgery     30 years ago  . Left hip surgery     30 years ago    There were no vitals filed for this visit.  Subjective Assessment - 07/21/18 1249    Subjective  I have been riding my bike (22 miles this weekend) so I am a little sore.  The needling helped me last week    Currently in Pain?  No/denies                       [facility] Adult PT Treatment/Exercise - 07/21/18 0001      Lumbar Exercises: Aerobic   UBE (Upper Arm Bike)  Level 2x 6 minutes (3/3) verbal cues for posture      Manual Therapy   Manual Therapy  Joint mobilization;Soft tissue mobilization    Manual therapy comments  elongation and trigger point release to bil thoracolumbar paraspinals    Joint Mobilization  T5-L3 PA mobilization grade 3       Trigger Point Dry Needling - 07/21/18 1251    Consent Given?  Yes    Muscles Treated Upper Body  -- T8-L3 Multifidi             PT Short Term Goals - 07/09/18 2671      PT SHORT TERM GOAL #1   Title   independent with initial HEP    Status  Achieved        PT Long Term Goals - 06/30/18 0858      PT LONG TERM GOAL #1   Title  independent with HEP and understand how to progress himself    Time  8    Period  Weeks    Status  New    Target Date  08/25/18      PT LONG TERM GOAL #2   Title  full lumbar ROM with increased mobility at T10-L2 area to reduce lumbar pain >/= 75%    Time  8    Period  Weeks    Status  New    Target Date  08/25/18      PT LONG TERM GOAL #3   Title  understand correct body mechanics with sitting at the computer and daily tasks to decrease strain on lumbar region    Time  8    Period  Weeks    Status  New    Target Date  08/25/18      PT LONG TERM GOAL #4   Title  FOTO score </= 15% limitation    Time  8    Period  Weeks    Status  New    Target Date  08/25/18            Plan - 07/21/18 1318    Clinical Impression Statement  Pt arrived late to appointment so limited exercise today.  Session focused on dry needling/manual therapy to address thoracic mobility.  Pt with improved tissue mobility and segmental mobility in the thoracic spine after dry needling today.  Pt reports 40% overall improvement and is making steady progress toward goals.  Pt will continue to benefit from skilled PT for thoracic flexibility, strength and manual therapy.      Rehab Potential  Excellent    PT Frequency  2x / week    PT Duration  8 weeks    PT Treatment/Interventions  Cryotherapy;Electrical Stimulation;Moist Heat;Traction;Ultrasound;Therapeutic activities;Therapeutic exercise;Patient/family education;Neuromuscular re-education;Manual techniques;Dry needling    PT Next Visit Plan  core strength, flexibility, manual to improve segmental mobility.  Assess Dry needling to thoracic spine.     PT Home Exercise Plan  access code: [email or web address]    Consulted and Agree with Plan of Care  Patient       Patient will benefit from skilled therapeutic intervention in order to  improve the following deficits and impairments:  Pain, Increased fascial restricitons, Decreased mobility, Decreased range of motion, Decreased strength, Increased muscle spasms  Visit Diagnosis: Chronic midline low back pain without sciatica     Problem List Patient Active Problem List   Diagnosis Date Noted  . Elevated hemoglobin ([place]) 01/25/2017  . Hematochezia 03/06/2016  . Elevated BP without diagnosis of hypertension 03/06/2016  . Actinic keratosis 10/10/2014  . Cough 06/30/2014  . Well adult exam 09/04/2012  . Tinea pedis 09/01/2012  . Neoplasm of uncertain behavior of skin 08/01/2011  . Asthma with exacerbation 06/06/2010  . DYSPNEA 06/06/2010  . ALLERGIC RHINITIS 11/24/2009  . Dyslipidemia 11/22/2009  . SKIN RASH 11/22/2009  . GERD 01/03/2009  . FATTY LIVER DISEASE 04/01/2008  . LIVER FUNCTION TESTS, ABNORMAL, HX OF 10/07/2007     Sigurd Sos, PT 07/21/18 1:22 PM PHYSICAL THERAPY DISCHARGE SUMMARY  Visits from Start of Care: 6  Current functional level related to goals / functional outcomes: See above for current status.  Pt didn't return to PT.   Remaining deficits: See above for current status.     Education / Equipment: HEP, posture Plan: Patient agrees to discharge.  Patient goals were partially met. Patient is being discharged due to not returning since the last visit.  ?????        Sigurd Sos, PT 09/01/18 2:42 PM  [facility] [address], [place], [place], [address] Phone: [phone]   Fax:  [phone]  Name: Aj Crunkleton MRN: [id number] Date of Birth: 03/10/1971

## 2018-08-15 ENCOUNTER — Encounter: Payer: BLUE CROSS/BLUE SHIELD | Admitting: Internal Medicine

## 2018-08-21 ENCOUNTER — Encounter: Payer: BLUE CROSS/BLUE SHIELD | Admitting: Internal Medicine

## 2018-08-22 ENCOUNTER — Ambulatory Visit (INDEPENDENT_AMBULATORY_CARE_PROVIDER_SITE_OTHER): Payer: BLUE CROSS/BLUE SHIELD | Admitting: Internal Medicine

## 2018-08-22 ENCOUNTER — Other Ambulatory Visit (INDEPENDENT_AMBULATORY_CARE_PROVIDER_SITE_OTHER): Payer: BLUE CROSS/BLUE SHIELD

## 2018-08-22 ENCOUNTER — Encounter: Payer: Self-pay | Admitting: Internal Medicine

## 2018-08-22 VITALS — BP 134/88 | HR 74 | Temp 98.3°F | Ht 62.0 in | Wt 177.0 lb

## 2018-08-22 DIAGNOSIS — Z0000 Encounter for general adult medical examination without abnormal findings: Secondary | ICD-10-CM

## 2018-08-22 DIAGNOSIS — E785 Hyperlipidemia, unspecified: Secondary | ICD-10-CM

## 2018-08-22 DIAGNOSIS — R05 Cough: Secondary | ICD-10-CM

## 2018-08-22 DIAGNOSIS — R059 Cough, unspecified: Secondary | ICD-10-CM

## 2018-08-22 LAB — HEPATIC FUNCTION PANEL
ALK PHOS: 79 U/L (ref 39–117)
ALT: 32 U/L (ref 0–53)
AST: 19 U/L (ref 0–37)
Albumin: 4.7 g/dL (ref 3.5–5.2)
BILIRUBIN DIRECT: 0.1 mg/dL (ref 0.0–0.3)
TOTAL PROTEIN: 7.3 g/dL (ref 6.0–8.3)
Total Bilirubin: 1 mg/dL (ref 0.2–1.2)

## 2018-08-22 LAB — URINALYSIS
Bilirubin Urine: NEGATIVE
Hgb urine dipstick: NEGATIVE
KETONES UR: NEGATIVE
Leukocytes, UA: NEGATIVE
Nitrite: NEGATIVE
Specific Gravity, Urine: 1.015 (ref 1.000–1.030)
TOTAL PROTEIN, URINE-UPE24: NEGATIVE
URINE GLUCOSE: NEGATIVE
Urobilinogen, UA: 0.2 (ref 0.0–1.0)
pH: 7 (ref 5.0–8.0)

## 2018-08-22 LAB — CBC WITH DIFFERENTIAL/PLATELET
BASOS ABS: 0 10*3/uL (ref 0.0–0.1)
Basophils Relative: 0.6 % (ref 0.0–3.0)
Eosinophils Absolute: 0.1 10*3/uL (ref 0.0–0.7)
Eosinophils Relative: 2 % (ref 0.0–5.0)
HCT: 46 % (ref 39.0–52.0)
Hemoglobin: 16.5 g/dL (ref 13.0–17.0)
LYMPHS ABS: 2.2 10*3/uL (ref 0.7–4.0)
LYMPHS PCT: 34.8 % (ref 12.0–46.0)
MCHC: 36 g/dL (ref 30.0–36.0)
MCV: 81.6 fl (ref 78.0–100.0)
Monocytes Absolute: 0.5 10*3/uL (ref 0.1–1.0)
Monocytes Relative: 7.3 % (ref 3.0–12.0)
NEUTROS ABS: 3.5 10*3/uL (ref 1.4–7.7)
NEUTROS PCT: 55.3 % (ref 43.0–77.0)
PLATELETS: 259 10*3/uL (ref 150.0–400.0)
RBC: 5.63 Mil/uL (ref 4.22–5.81)
RDW: 13.1 % (ref 11.5–15.5)
WBC: 6.4 10*3/uL (ref 4.0–10.5)

## 2018-08-22 LAB — LIPID PANEL
CHOL/HDL RATIO: 5
Cholesterol: 185 mg/dL (ref 0–200)
HDL: 35.3 mg/dL — ABNORMAL LOW (ref >39.00)
NONHDL: 150.12
Triglycerides: 225 mg/dL — ABNORMAL HIGH (ref 0.0–149.0)
VLDL: 45 mg/dL — ABNORMAL HIGH (ref 0.0–40.0)

## 2018-08-22 LAB — LDL CHOLESTEROL, DIRECT: Direct LDL: 129 mg/dL

## 2018-08-22 LAB — BASIC METABOLIC PANEL
BUN: 10 mg/dL (ref 6–23)
CO2: 31 mEq/L (ref 19–32)
CREATININE: 0.99 mg/dL (ref 0.40–1.50)
Calcium: 9.9 mg/dL (ref 8.4–10.5)
Chloride: 102 mEq/L (ref 96–112)
GFR: 86.59 mL/min (ref >60.00)
Glucose, Bld: 83 mg/dL (ref 70–99)
Potassium: 4.4 mEq/L (ref 3.5–5.1)
Sodium: 140 mEq/L (ref 135–145)

## 2018-08-22 LAB — PSA: PSA: 3.03 ng/mL (ref 0.10–4.00)

## 2018-08-22 LAB — TSH: TSH: 1.72 u[IU]/mL (ref 0.35–4.50)

## 2018-08-22 MED ORDER — OMEGA-3-ACID ETHYL ESTERS 1 G PO CAPS: 2.0000 | ORAL_CAPSULE | Freq: Two times a day (BID) | ORAL | 11 refills | Status: DC

## 2018-08-22 MED ORDER — PSEUDOEPHEDRINE HCL ER 120 MG PO TB12: 120.0000 mg | ORAL_TABLET | Freq: Two times a day (BID) | ORAL | 2 refills | Status: AC | PRN

## 2018-08-22 NOTE — Assessment & Plan Note (Signed)
Labs Wt loss 

## 2018-08-22 NOTE — Assessment & Plan Note (Signed)
Lovaza 

## 2018-08-22 NOTE — Progress Notes (Signed)
Subjective:  Patient ID: [name], male    DOB: 05-12-1971  Age: 47 y.o. MRN: [id number]  CC: No chief complaint on file.   HPI [name] presents for a well exam  Outpatient Medications Prior to Visit  Medication Sig Dispense Refill  . Cholecalciferol (VITAMIN D-1000 MAX ST) 1000 units tablet Take by mouth.    . omega-3 acid ethyl esters (LOVAZA) 1 g capsule TAKE 2 CAPSULES (2 G TOTAL) BY MOUTH 2 (TWO) TIMES DAILY. 360 capsule 11   No facility-administered medications prior to visit.     ROS: Review of Systems  Constitutional: Negative for appetite change, fatigue and unexpected weight change.  HENT: Negative for congestion, nosebleeds, sneezing, sore throat and trouble swallowing.   Eyes: Negative for itching and visual disturbance.  Respiratory: Negative for cough.   Cardiovascular: Negative for chest pain, palpitations and leg swelling.  Gastrointestinal: Negative for abdominal distention, blood in stool, diarrhea and nausea.  Genitourinary: Negative for frequency and hematuria.  Musculoskeletal: Negative for back pain, gait problem, joint swelling and neck pain.  Skin: Negative for rash.  Neurological: Negative for dizziness, tremors, speech difficulty and weakness.  Psychiatric/Behavioral: Negative for agitation, dysphoric mood, sleep disturbance and suicidal ideas. The patient is not nervous/anxious.     Objective:  BP 134/88 (BP Location: Left Arm, Patient Position: Sitting, Cuff Size: Normal)   Pulse 74   Temp 98.3 F (36.8 C) (Oral)   Ht 5\' 2"  (1.575 m)   Wt 177 lb (80.3 kg)   SpO2 96%   BMI 32.37 kg/m   BP Readings from Last 3 Encounters:  08/22/18 134/88  06/16/18 (!) 142/97  05/20/18 126/80    Wt Readings from Last 3 Encounters:  08/22/18 177 lb (80.3 kg)  06/16/18 175 lb (79.4 kg)  05/20/18 178 lb 1.9 oz (80.8 kg)    Physical Exam  Constitutional: He is oriented to person, place, and time. He appears well-developed. No distress.  NAD    HENT:  Mouth/Throat: Oropharynx is clear and moist.  Eyes: Pupils are equal, round, and reactive to light. Conjunctivae are normal.  Neck: Normal range of motion. No JVD present. No thyromegaly present.  Cardiovascular: Normal rate, regular rhythm, normal heart sounds and intact distal pulses. Exam reveals no gallop and no friction rub.  No murmur heard. Pulmonary/Chest: Effort normal and breath sounds normal. No respiratory distress. He has no wheezes. He has no rales. He exhibits no tenderness.  Abdominal: Soft. Bowel sounds are normal. He exhibits no distension and no mass. There is no tenderness. There is no rebound and no guarding.  Musculoskeletal: Normal range of motion. He exhibits no edema or tenderness.  Lymphadenopathy:    He has no cervical adenopathy.  Neurological: He is alert and oriented to person, place, and time. He has normal reflexes. No cranial nerve deficit. He exhibits normal muscle tone. He displays a negative Romberg sign. Coordination and gait normal.  Skin: Skin is warm and dry. No rash noted.  Psychiatric: He has a normal mood and affect. His behavior is normal. Judgment and thought content normal.    Lab Results  Component Value Date   WBC 6.6 03/10/2018   HGB 16.7 03/10/2018   HCT 46.9 03/10/2018   PLT 284.0 03/10/2018   GLUCOSE 95 03/10/2018   CHOL 187 03/10/2018   TRIG 198.0 (H) 03/10/2018   HDL 38.30 (L) 03/10/2018   LDLDIRECT 115.0 08/13/2017   LDLCALC 109 (H) 03/10/2018   ALT 56 (H) 03/10/2018  AST 26 03/10/2018   NA 138 03/10/2018   K 4.0 03/10/2018   CL 103 03/10/2018   CREATININE 0.88 03/10/2018   BUN 10 03/10/2018   CO2 28 03/10/2018   TSH 1.21 08/13/2017   PSA 2.25 08/13/2017   INR 1.1 (H) 03/06/2016    No results found.  Assessment & Plan:   There are no diagnoses linked to this encounter.   No orders of the defined types were placed in this encounter.    Follow-up: No follow-ups on file.  Walker Kehr, MD

## 2018-08-22 NOTE — Assessment & Plan Note (Signed)
No recent relapse 

## 2019-08-12 ENCOUNTER — Telehealth: Payer: Self-pay | Admitting: *Deleted

## 2019-08-12 DIAGNOSIS — Z0000 Encounter for general adult medical examination without abnormal findings: Secondary | ICD-10-CM

## 2019-08-12 NOTE — Telephone Encounter (Signed)
Ok Thx 

## 2019-08-12 NOTE — Telephone Encounter (Signed)
Copied from [place] [id number]. Topic: Appointment Scheduling - Scheduling Inquiry for Clinic >> Aug 11, 2019 11:55 AM Erick Blinks wrote: Reason for CRM: Pt is requesting lab work before scheduled CPE on 08/25/2019. Please advise Best contact: [phone]

## 2019-08-13 NOTE — Telephone Encounter (Signed)
Pt informed labs ordered.

## 2019-08-26 ENCOUNTER — Other Ambulatory Visit (INDEPENDENT_AMBULATORY_CARE_PROVIDER_SITE_OTHER): Payer: BC Managed Care – PPO

## 2019-08-26 DIAGNOSIS — Z0000 Encounter for general adult medical examination without abnormal findings: Secondary | ICD-10-CM | POA: Diagnosis not present

## 2019-08-26 DIAGNOSIS — Z125 Encounter for screening for malignant neoplasm of prostate: Secondary | ICD-10-CM

## 2019-08-26 LAB — LIPID PANEL
Cholesterol: 213 mg/dL — ABNORMAL HIGH (ref 0–200)
HDL: 36 mg/dL — ABNORMAL LOW (ref >39.00)
LDL Cholesterol: 148 mg/dL — ABNORMAL HIGH (ref 0–99)
NonHDL: 176.65
Total CHOL/HDL Ratio: 6
Triglycerides: 141 mg/dL (ref 0.0–149.0)
VLDL: 28.2 mg/dL (ref 0.0–40.0)

## 2019-08-26 LAB — CBC WITH DIFFERENTIAL/PLATELET
Basophils Absolute: 0 10*3/uL (ref 0.0–0.1)
Basophils Relative: 0.7 % (ref 0.0–3.0)
Eosinophils Absolute: 0.2 10*3/uL (ref 0.0–0.7)
Eosinophils Relative: 2.7 % (ref 0.0–5.0)
HCT: 45.7 % (ref 39.0–52.0)
Hemoglobin: 16.2 g/dL (ref 13.0–17.0)
Lymphocytes Relative: 38.6 % (ref 12.0–46.0)
Lymphs Abs: 2.5 10*3/uL (ref 0.7–4.0)
MCHC: 35.5 g/dL (ref 30.0–36.0)
MCV: 83 fl (ref 78.0–100.0)
Monocytes Absolute: 0.5 10*3/uL (ref 0.1–1.0)
Monocytes Relative: 7.7 % (ref 3.0–12.0)
Neutro Abs: 3.3 10*3/uL (ref 1.4–7.7)
Neutrophils Relative %: 50.3 % (ref 43.0–77.0)
Platelets: 237 10*3/uL (ref 150.0–400.0)
RBC: 5.51 Mil/uL (ref 4.22–5.81)
RDW: 13.2 % (ref 11.5–15.5)
WBC: 6.5 10*3/uL (ref 4.0–10.5)

## 2019-08-26 LAB — URINALYSIS
Bilirubin Urine: NEGATIVE
Hgb urine dipstick: NEGATIVE
Ketones, ur: NEGATIVE
Leukocytes,Ua: NEGATIVE
Nitrite: NEGATIVE
Specific Gravity, Urine: 1.015 (ref 1.000–1.030)
Total Protein, Urine: NEGATIVE
Urine Glucose: NEGATIVE
Urobilinogen, UA: 0.2 (ref 0.0–1.0)
pH: 7.5 (ref 5.0–8.0)

## 2019-08-26 LAB — BASIC METABOLIC PANEL
BUN: 11 mg/dL (ref 6–23)
CO2: 27 mEq/L (ref 19–32)
Calcium: 9.5 mg/dL (ref 8.4–10.5)
Chloride: 103 mEq/L (ref 96–112)
Creatinine, Ser: 0.95 mg/dL (ref 0.40–1.50)
GFR: 85.06 mL/min (ref >60.00)
Glucose, Bld: 89 mg/dL (ref 70–99)
Potassium: 3.9 mEq/L (ref 3.5–5.1)
Sodium: 138 mEq/L (ref 135–145)

## 2019-08-26 LAB — HEPATIC FUNCTION PANEL
ALT: 38 U/L (ref 0–53)
AST: 22 U/L (ref 0–37)
Albumin: 4.4 g/dL (ref 3.5–5.2)
Alkaline Phosphatase: 77 U/L (ref 39–117)
Bilirubin, Direct: 0.1 mg/dL (ref 0.0–0.3)
Total Bilirubin: 1 mg/dL (ref 0.2–1.2)
Total Protein: 7 g/dL (ref 6.0–8.3)

## 2019-08-26 LAB — TSH: TSH: 2.16 u[IU]/mL (ref 0.35–4.50)

## 2019-08-26 LAB — PSA: PSA: 1.26 ng/mL (ref 0.10–4.00)

## 2019-09-01 ENCOUNTER — Encounter: Payer: Self-pay | Admitting: Internal Medicine

## 2019-09-01 ENCOUNTER — Ambulatory Visit (INDEPENDENT_AMBULATORY_CARE_PROVIDER_SITE_OTHER): Payer: BC Managed Care – PPO | Admitting: Internal Medicine

## 2019-09-01 ENCOUNTER — Other Ambulatory Visit: Payer: Self-pay

## 2019-09-01 VITALS — BP 132/78 | HR 74 | Temp 98.5°F | Ht 62.0 in | Wt 177.0 lb

## 2019-09-01 DIAGNOSIS — Z0000 Encounter for general adult medical examination without abnormal findings: Secondary | ICD-10-CM | POA: Diagnosis not present

## 2019-09-01 DIAGNOSIS — Z23 Encounter for immunization: Secondary | ICD-10-CM | POA: Diagnosis not present

## 2019-09-01 MED ORDER — OMEGA-3-ACID ETHYL ESTERS 1 G PO CAPS: 2.0000 | ORAL_CAPSULE | Freq: Two times a day (BID) | ORAL | 11 refills | Status: DC

## 2019-09-01 MED ORDER — FLUTICASONE-SALMETEROL 100-50 MCG/DOSE IN AEPB: 1.0000 | INHALATION_SPRAY | Freq: Two times a day (BID) | RESPIRATORY_TRACT | 5 refills | Status: DC

## 2019-09-01 MED ORDER — NAFTIFINE HCL 1 % EX CREA: TOPICAL_CREAM | Freq: Every day | CUTANEOUS | 3 refills | Status: DC

## 2019-09-01 NOTE — Patient Instructions (Signed)
These suggestions will probably help you to improve your metabolism if you are not overweight and to lose weight if you are overweight: 1.  Reduce your consumption of sugars and starches.  Eliminate high fructose corn syrup from your diet.  Reduce your consumption of processed foods.  For desserts try to have seasonal fruits, berries, nuts, cheeses or dark chocolate with more than 70% cacao. 2.  Do not snack 3.  You do not have to eat breakfast.  If you choose to have breakfast-eat plain greek yogurt, eggs, oatmeal (without sugar) 4.  Drink water, freshly brewed unsweetened tea (green, black or herbal) or coffee.  Do not drink sodas including diet sodas , juices, beverages sweetened with artificial sweeteners. 5.  Reduce your consumption of refined grains. 6.  Avoid protein drinks such as Optifast, Slim fast etc. Eat chicken, fish, meat, dairy and beans for your sources of protein 7.  Natural unprocessed fats like cold pressed virgin olive oil, butter, coconut oil are good for you.  Eat avocados 8.  Increase your consumption of fiber.  Fruits, berries, vegetables, whole grains, flaxseeds, Chia seeds, beans, popcorn, nuts, oatmeal are good sources of fiber 9.  Use vinegar in your diet, i.e. apple cider vinegar, red wine or balsamic vinegar 10.  You can try fasting.  For example you can skip breakfast and lunch every other day (24-hour fast) 11.  Stress reduction, good night sleep, relaxation, meditation, yoga and other physical activity is likely to help you to maintain low weight too. 12.  If you drink alcohol, limit your alcohol intake to no more than 2 drinks a day.   Mediterranean diet is good for you. ([employer] has a typical Mediterranean cuisine menu) The Mediterranean diet is a way of eating based on the traditional cuisine of countries bordering the [place] Sea. While there is no single definition of the Mediterranean diet, it is typically high in vegetables, fruits, whole grains,  beans, nut and seeds, and olive oil. The main components of Mediterranean diet include: . Daily consumption of vegetables, fruits, whole grains and healthy fats  . Weekly intake of fish, poultry, beans and eggs  . Moderate portions of dairy products  . Limited intake of red meat Other important elements of the Mediterranean diet are sharing meals with family and friends, enjoying a glass of red wine and being physically active. Health benefits of a Mediterranean diet: A traditional Mediterranean diet consisting of large quantities of fresh fruits and vegetables, nuts, fish and olive oil-coupled with physical activity-can reduce your risk of serious mental and physical health problems by: Preventing heart disease and strokes. Following a Mediterranean diet limits your intake of refined breads, processed foods, and red meat, and encourages drinking red wine instead of hard liquor-all factors that can help prevent heart disease and stroke. Keeping you agile. If you're an older adult, the nutrients gained with a Mediterranean diet may reduce your risk of developing muscle weakness and other signs of frailty by about 70 percent. Reducing the risk of Alzheimer's. Research suggests that the Mediterranean diet may improve cholesterol, blood sugar levels, and overall blood vessel health, which in turn may reduce your risk of Alzheimer's disease or dementia. Halving the risk of Parkinson's disease. The high levels of antioxidants in the Mediterranean diet can prevent cells from undergoing a damaging process called oxidative stress, thereby cutting the risk of Parkinson's disease in half. Increasing longevity. By reducing your risk of developing heart disease or cancer with the Mediterranean diet,   you're reducing your risk of death at any age by 20%. Protecting against type 2 diabetes. A Mediterranean diet is rich in fiber which digests slowly, prevents huge swings in blood sugar, and can help you maintain a  healthy weight.    Cabbage soup recipe that will not make you gain weight: Take 1 small head of cabbage, 1 average pack of celery, 4 green peppers, 4 onions, 2 cans diced tomatoes (they are not available without salt), salt and spices to taste.  Chop cabbage, celery, peppers and onions.  And tomatoes and 2-2.5 liters (2.5 quarts) of water so that it would just cover the vegetables.  Bring to boil.  Add spices and salt.  Turn heat to low/medium and simmer for 20-25 minutes.  Naturally, you can make a smaller batch and change some of the ingredients.   Cardiac CT calcium scoring test $150   Computed tomography, more commonly known as a CT or CAT scan, is a diagnostic medical imaging test. Like traditional x-rays, it produces multiple images or pictures of the inside of the body. The cross-sectional images generated during a CT scan can be reformatted in multiple planes. They can even generate three-dimensional images. These images can be viewed on a computer monitor, printed on film or by a 3D printer, or transferred to a CD or DVD. CT images of internal organs, bones, soft tissue and blood vessels provide greater detail than traditional x-rays, particularly of soft tissues and blood vessels. A cardiac CT scan for coronary calcium is a non-invasive way of obtaining information about the presence, location and extent of calcified plaque in the coronary arteries-the vessels that supply oxygen-containing blood to the heart muscle. Calcified plaque results when there is a build-up of fat and other substances under the inner layer of the artery. This material can calcify which signals the presence of atherosclerosis, a disease of the vessel wall, also called coronary artery disease (CAD). People with this disease have an increased risk for heart attacks. In addition, over time, progression of plaque build up (CAD) can narrow the arteries or even close off blood flow to the heart. The result may be chest pain,  sometimes called "angina," or a heart attack. Because calcium is a marker of CAD, the amount of calcium detected on a cardiac CT scan is a helpful prognostic tool. The findings on cardiac CT are expressed as a calcium score. Another name for this test is coronary artery calcium scoring.  What are some common uses of the procedure? The goal of cardiac CT scan for calcium scoring is to determine if CAD is present and to what extent, even if there are no symptoms. It is a screening study that may be recommended by a physician for patients with risk factors for CAD but no clinical symptoms. The major risk factors for CAD are: . high blood cholesterol levels  . family history of heart attacks  . diabetes  . high blood pressure  . cigarette smoking  . overweight or obese  . physical inactivity   A negative cardiac CT scan for calcium scoring shows no calcification within the coronary arteries. This suggests that CAD is absent or so minimal it cannot be seen by this technique. The chance of having a heart attack over the next two to five years is very low under these circumstances. A positive test means that CAD is present, regardless of whether or not the patient is experiencing any symptoms. The amount of calcification-expressed as the calcium score-may   help to predict the likelihood of a myocardial infarction (heart attack) in the coming years and helps your medical doctor or cardiologist decide whether the patient may need to take preventive medicine or undertake other measures such as diet and exercise to lower the risk for heart attack. The extent of CAD is graded according to your calcium score:  Calcium Score  Presence of CAD (coronary artery disease)  0 No evidence of CAD   1-10 Minimal evidence of CAD  11-100 Mild evidence of CAD  101-400 Moderate evidence of CAD  Over 400 Extensive evidence of CAD    

## 2019-09-01 NOTE — Assessment & Plan Note (Signed)
We discussed age appropriate health related issues, including available/recomended screening tests and vaccinations. We discussed a need for adhering to healthy diet and exercise. Labs/EKG were reviewed/ordered. All questions were answered.   

## 2019-09-01 NOTE — Progress Notes (Signed)
Subjective:  Patient ID: [name], male    DOB: [birth year]  Age: 49 y.o. MRN: [id number]  CC: No chief complaint on file.   HPI Aravind Taiwo presents for well exam  Outpatient Medications Prior to Visit  Medication Sig Dispense Refill  . Cholecalciferol (VITAMIN D-1000 MAX ST) 1000 units tablet Take by mouth.    . omega-3 acid ethyl esters (LOVAZA) 1 g capsule Take 2 capsules (2 g total) by mouth 2 (two) times daily. 360 capsule 11   No facility-administered medications prior to visit.     ROS: Review of Systems  Constitutional: Negative for appetite change, fatigue and unexpected weight change.  HENT: Negative for congestion, nosebleeds, sneezing, sore throat and trouble swallowing.   Eyes: Negative for itching and visual disturbance.  Respiratory: Negative for cough.   Cardiovascular: Negative for chest pain, palpitations and leg swelling.  Gastrointestinal: Negative for abdominal distention, blood in stool, diarrhea and nausea.  Genitourinary: Negative for frequency and hematuria.  Musculoskeletal: Negative for back pain, gait problem, joint swelling and neck pain.  Skin: Negative for rash.  Neurological: Negative for dizziness, tremors, speech difficulty and weakness.  Psychiatric/Behavioral: Negative for agitation, dysphoric mood and sleep disturbance. The patient is not nervous/anxious.     Objective:  BP 132/78 (BP Location: Left Arm, Patient Position: Sitting, Cuff Size: Large)   Pulse 74   Temp 98.5 F (36.9 C) (Oral)   Ht 5\' 2"  (1.575 m)   Wt 177 lb (80.3 kg)   SpO2 97%   BMI 32.37 kg/m   BP Readings from Last 3 Encounters:  09/01/19 132/78  08/22/18 134/88  06/16/18 (!) 142/97    Wt Readings from Last 3 Encounters:  09/01/19 177 lb (80.3 kg)  08/22/18 177 lb (80.3 kg)  06/16/18 175 lb (79.4 kg)    Physical Exam Constitutional:      General: He is not in acute distress.    Appearance: He is well-developed.     Comments: NAD  Eyes:   Conjunctiva/sclera: Conjunctivae normal.     Pupils: Pupils are equal, round, and reactive to light.  Neck:     Musculoskeletal: Normal range of motion.     Thyroid: No thyromegaly.     Vascular: No JVD.  Cardiovascular:     Rate and Rhythm: Normal rate and regular rhythm.     Heart sounds: Normal heart sounds. No murmur. No friction rub. No gallop.   Pulmonary:     Effort: Pulmonary effort is normal. No respiratory distress.     Breath sounds: Normal breath sounds. No wheezing or rales.  Chest:     Chest wall: No tenderness.  Abdominal:     General: Bowel sounds are normal. There is no distension.     Palpations: Abdomen is soft. There is no mass.     Tenderness: There is no abdominal tenderness. There is no guarding or rebound.  Musculoskeletal: Normal range of motion.        General: No tenderness.  Lymphadenopathy:     Cervical: No cervical adenopathy.  Skin:    General: Skin is warm and dry.     Findings: No rash.  Neurological:     Mental Status: He is alert and oriented to person, place, and time.     Cranial Nerves: No cranial nerve deficit.     Motor: No abnormal muscle tone.     Coordination: Coordination normal.     Gait: Gait normal.     Deep Tendon Reflexes: Reflexes  are normal and symmetric.  Psychiatric:        Behavior: Behavior normal.        Thought Content: Thought content normal.        Judgment: Judgment normal.     Lab Results  Component Value Date   WBC 6.5 08/26/2019   HGB 16.2 08/26/2019   HCT 45.7 08/26/2019   PLT 237.0 08/26/2019   GLUCOSE 89 08/26/2019   CHOL 213 (H) 08/26/2019   TRIG 141.0 08/26/2019   HDL 36.00 (L) 08/26/2019   LDLDIRECT 129.0 08/22/2018   LDLCALC 148 (H) 08/26/2019   ALT 38 08/26/2019   AST 22 08/26/2019   NA 138 08/26/2019   K 3.9 08/26/2019   CL 103 08/26/2019   CREATININE 0.95 08/26/2019   BUN 11 08/26/2019   CO2 27 08/26/2019   TSH 2.16 08/26/2019   PSA 1.26 08/26/2019   INR 1.1 (H) 03/06/2016    No  results found.  Assessment & Plan:   There are no diagnoses linked to this encounter.   No orders of the defined types were placed in this encounter.    Follow-up: No follow-ups on file.  Walker Kehr, MD

## 2019-09-13 ENCOUNTER — Encounter: Payer: Self-pay | Admitting: Internal Medicine

## 2019-10-07 ENCOUNTER — Other Ambulatory Visit: Payer: Self-pay | Admitting: Internal Medicine

## 2020-09-29 ENCOUNTER — Other Ambulatory Visit: Payer: Self-pay | Admitting: Internal Medicine

## 2020-10-12 ENCOUNTER — Telehealth: Payer: Self-pay | Admitting: Internal Medicine

## 2020-10-12 DIAGNOSIS — Z0000 Encounter for general adult medical examination without abnormal findings: Secondary | ICD-10-CM

## 2020-10-12 NOTE — Telephone Encounter (Signed)
    Patient is requesting order for annual labs prior to 11/1 physical

## 2020-10-13 NOTE — Telephone Encounter (Signed)
Notified pt ok for cpx labs.. made lab appt for tomorrow 10/14/20.Marland KitchenJohny Chess

## 2020-10-14 ENCOUNTER — Other Ambulatory Visit (INDEPENDENT_AMBULATORY_CARE_PROVIDER_SITE_OTHER): Payer: No Typology Code available for payment source

## 2020-10-14 ENCOUNTER — Other Ambulatory Visit: Payer: Self-pay

## 2020-10-14 DIAGNOSIS — Z125 Encounter for screening for malignant neoplasm of prostate: Secondary | ICD-10-CM

## 2020-10-14 DIAGNOSIS — Z0000 Encounter for general adult medical examination without abnormal findings: Secondary | ICD-10-CM | POA: Diagnosis not present

## 2020-10-14 LAB — CBC WITH DIFFERENTIAL/PLATELET
Basophils Absolute: 0 10*3/uL (ref 0.0–0.1)
Basophils Relative: 0.7 % (ref 0.0–3.0)
Eosinophils Absolute: 0.2 10*3/uL (ref 0.0–0.7)
Eosinophils Relative: 2.6 % (ref 0.0–5.0)
HCT: 47.2 % (ref 39.0–52.0)
Hemoglobin: 16.5 g/dL (ref 13.0–17.0)
Lymphocytes Relative: 36.9 % (ref 12.0–46.0)
Lymphs Abs: 2.6 10*3/uL (ref 0.7–4.0)
MCHC: 34.9 g/dL (ref 30.0–36.0)
MCV: 83.2 fl (ref 78.0–100.0)
Monocytes Absolute: 0.5 10*3/uL (ref 0.1–1.0)
Monocytes Relative: 7 % (ref 3.0–12.0)
Neutro Abs: 3.7 10*3/uL (ref 1.4–7.7)
Neutrophils Relative %: 52.8 % (ref 43.0–77.0)
Platelets: 242 10*3/uL (ref 150.0–400.0)
RBC: 5.67 Mil/uL (ref 4.22–5.81)
RDW: 13.4 % (ref 11.5–15.5)
WBC: 7 10*3/uL (ref 4.0–10.5)

## 2020-10-14 LAB — URINALYSIS, ROUTINE W REFLEX MICROSCOPIC
Bilirubin Urine: NEGATIVE
Hgb urine dipstick: NEGATIVE
Ketones, ur: NEGATIVE
Leukocytes,Ua: NEGATIVE
Nitrite: NEGATIVE
RBC / HPF: NONE SEEN (ref 0–?)
Specific Gravity, Urine: 1.015 (ref 1.000–1.030)
Total Protein, Urine: NEGATIVE
Urine Glucose: NEGATIVE
Urobilinogen, UA: 0.2 (ref 0.0–1.0)
pH: 6.5 (ref 5.0–8.0)

## 2020-10-14 LAB — BASIC METABOLIC PANEL
BUN: 12 mg/dL (ref 6–23)
CO2: 29 mEq/L (ref 19–32)
Calcium: 9.7 mg/dL (ref 8.4–10.5)
Chloride: 101 mEq/L (ref 96–112)
Creatinine, Ser: 0.99 mg/dL (ref 0.40–1.50)
GFR: 90.46 mL/min (ref >60.00)
Glucose, Bld: 81 mg/dL (ref 70–99)
Potassium: 4.1 mEq/L (ref 3.5–5.1)
Sodium: 138 mEq/L (ref 135–145)

## 2020-10-14 LAB — HEPATIC FUNCTION PANEL
ALT: 89 U/L — ABNORMAL HIGH (ref 0–53)
AST: 42 U/L — ABNORMAL HIGH (ref 0–37)
Albumin: 4.6 g/dL (ref 3.5–5.2)
Alkaline Phosphatase: 80 U/L (ref 39–117)
Bilirubin, Direct: 0.1 mg/dL (ref 0.0–0.3)
Total Bilirubin: 0.7 mg/dL (ref 0.2–1.2)
Total Protein: 7.2 g/dL (ref 6.0–8.3)

## 2020-10-14 LAB — LIPID PANEL
Cholesterol: 216 mg/dL — ABNORMAL HIGH (ref 0–200)
HDL: 39 mg/dL — ABNORMAL LOW (ref >39.00)
NonHDL: 177.11
Total CHOL/HDL Ratio: 6
Triglycerides: 287 mg/dL — ABNORMAL HIGH (ref 0.0–149.0)
VLDL: 57.4 mg/dL — ABNORMAL HIGH (ref 0.0–40.0)

## 2020-10-14 LAB — LDL CHOLESTEROL, DIRECT: Direct LDL: 134 mg/dL

## 2020-10-14 LAB — PSA: PSA: 1 ng/mL (ref 0.10–4.00)

## 2020-10-14 LAB — TSH: TSH: 2.36 u[IU]/mL (ref 0.35–4.50)

## 2020-10-17 ENCOUNTER — Ambulatory Visit (INDEPENDENT_AMBULATORY_CARE_PROVIDER_SITE_OTHER): Payer: No Typology Code available for payment source | Admitting: Internal Medicine

## 2020-10-17 ENCOUNTER — Encounter: Payer: Self-pay | Admitting: Internal Medicine

## 2020-10-17 ENCOUNTER — Other Ambulatory Visit: Payer: Self-pay

## 2020-10-17 VITALS — BP 128/82 | HR 89 | Temp 98.5°F | Ht 62.0 in | Wt 184.4 lb

## 2020-10-17 DIAGNOSIS — K760 Fatty (change of) liver, not elsewhere classified: Secondary | ICD-10-CM

## 2020-10-17 DIAGNOSIS — E785 Hyperlipidemia, unspecified: Secondary | ICD-10-CM

## 2020-10-17 DIAGNOSIS — Z0000 Encounter for general adult medical examination without abnormal findings: Secondary | ICD-10-CM | POA: Diagnosis not present

## 2020-10-17 DIAGNOSIS — D582 Other hemoglobinopathies: Secondary | ICD-10-CM

## 2020-10-17 MED ORDER — OMEGA-3-ACID ETHYL ESTERS 1 G PO CAPS
2.0000 | ORAL_CAPSULE | Freq: Two times a day (BID) | ORAL | 3 refills | Status: DC
Start: 2020-10-17 — End: 2020-10-17

## 2020-10-17 MED ORDER — FLUTICASONE-SALMETEROL 100-50 MCG/DOSE IN AEPB
1.0000 | INHALATION_SPRAY | Freq: Two times a day (BID) | RESPIRATORY_TRACT | 5 refills | Status: DC
Start: 2020-10-17 — End: 2021-12-14

## 2020-10-17 MED ORDER — OMEGA-3-ACID ETHYL ESTERS 1 G PO CAPS
2.0000 | ORAL_CAPSULE | Freq: Two times a day (BID) | ORAL | 3 refills | Status: DC
Start: 2020-10-17 — End: 2021-12-14

## 2020-10-17 MED ORDER — TRIAMCINOLONE ACETONIDE 0.1 % EX OINT: 1.0000 "application " | TOPICAL_OINTMENT | Freq: Three times a day (TID) | CUTANEOUS | 2 refills | Status: DC

## 2020-10-17 NOTE — Progress Notes (Signed)
Subjective:  Patient ID: [name], male    DOB: [birth year]  Age: 49 y.o. MRN: [id number]  CC: Annual Exam   HPI [name] presents for a well exam Wt gain  Outpatient Medications Prior to Visit  Medication Sig Dispense Refill  . Cholecalciferol (VITAMIN D-1000 MAX ST) 1000 units tablet Take by mouth.    . Fluticasone-Salmeterol (ADVAIR DISKUS) 100-50 MCG/DOSE AEPB Inhale 1 puff into the lungs 2 (two) times daily. (Patient not taking: Reported on 10/17/2020) 1 each 5  . omega-3 acid ethyl esters (LOVAZA) 1 g capsule Take 2 capsules (2 g total) by mouth 2 (two) times daily. Annual appt due in Nov must see provider for future refills (Patient not taking: Reported on 10/17/2020) 120 capsule 0  . naftifine (NAFTIN) 1 % cream Apply topically daily. (Patient not taking: Reported on 10/17/2020) 30 g 3   No facility-administered medications prior to visit.    ROS: Review of Systems  Constitutional: Positive for unexpected weight change. Negative for appetite change and fatigue.  HENT: Negative for congestion, nosebleeds, sneezing, sore throat and trouble swallowing.   Eyes: Negative for itching and visual disturbance.  Respiratory: Negative for cough.   Cardiovascular: Negative for chest pain, palpitations and leg swelling.  Gastrointestinal: Negative for abdominal distention, blood in stool, diarrhea and nausea.  Genitourinary: Negative for frequency and hematuria.  Musculoskeletal: Negative for back pain, gait problem, joint swelling and neck pain.  Skin: Negative for rash.  Neurological: Negative for dizziness, tremors, speech difficulty and weakness.  Psychiatric/Behavioral: Negative for agitation, dysphoric mood and sleep disturbance. The patient is not nervous/anxious.     Objective:  BP 128/82 (BP Location: Left Arm)   Pulse 89   Temp 98.5 F (36.9 C) (Oral)   Ht 5\' 2"  (1.575 m)   Wt 184 lb 6.4 oz (83.6 kg)   SpO2 96%   BMI 33.73 kg/m   BP Readings from Last 3  Encounters:  10/17/20 128/82  09/01/19 132/78  08/22/18 134/88    Wt Readings from Last 3 Encounters:  10/17/20 184 lb 6.4 oz (83.6 kg)  09/01/19 177 lb (80.3 kg)  08/22/18 177 lb (80.3 kg)    Physical Exam Constitutional:      General: He is not in acute distress.    Appearance: He is well-developed. He is obese.     Comments: NAD  Eyes:     Conjunctiva/sclera: Conjunctivae normal.     Pupils: Pupils are equal, round, and reactive to light.  Neck:     Thyroid: No thyromegaly.     Vascular: No JVD.  Cardiovascular:     Rate and Rhythm: Normal rate and regular rhythm.     Heart sounds: Normal heart sounds. No murmur heard.  No friction rub. No gallop.   Pulmonary:     Effort: Pulmonary effort is normal. No respiratory distress.     Breath sounds: Normal breath sounds. No wheezing or rales.  Chest:     Chest wall: No tenderness.  Abdominal:     General: Bowel sounds are normal. There is no distension.     Palpations: Abdomen is soft. There is no mass.     Tenderness: There is no abdominal tenderness. There is no guarding or rebound.  Musculoskeletal:        General: No tenderness. Normal range of motion.     Cervical back: Normal range of motion.  Lymphadenopathy:     Cervical: No cervical adenopathy.  Skin:    General: Skin  is warm and dry.     Findings: No rash.  Neurological:     Mental Status: He is alert and oriented to person, place, and time.     Cranial Nerves: No cranial nerve deficit.     Motor: No abnormal muscle tone.     Coordination: Coordination normal.     Gait: Gait normal.     Deep Tendon Reflexes: Reflexes are normal and symmetric.  Psychiatric:        Behavior: Behavior normal.        Thought Content: Thought content normal.        Judgment: Judgment normal.     Lab Results  Component Value Date   WBC 7.0 10/14/2020   HGB 16.5 10/14/2020   HCT 47.2 10/14/2020   PLT 242.0 10/14/2020   GLUCOSE 81 10/14/2020   CHOL 216 (H) 10/14/2020    TRIG 287.0 (H) 10/14/2020   HDL 39.00 (L) 10/14/2020   LDLDIRECT 134.0 10/14/2020   LDLCALC 148 (H) 08/26/2019   ALT 89 (H) 10/14/2020   AST 42 (H) 10/14/2020   NA 138 10/14/2020   K 4.1 10/14/2020   CL 101 10/14/2020   CREATININE 0.99 10/14/2020   BUN 12 10/14/2020   CO2 29 10/14/2020   TSH 2.36 10/14/2020   PSA 1.00 10/14/2020   INR 1.1 (H) 03/06/2016    No results found.  Assessment & Plan:    Richard Kehr, MD

## 2020-10-17 NOTE — Assessment & Plan Note (Signed)

## 2020-10-17 NOTE — Assessment & Plan Note (Signed)
Fasting Wt loss

## 2020-10-17 NOTE — Assessment & Plan Note (Addendum)
[place] Loose wt

## 2020-10-17 NOTE — Assessment & Plan Note (Signed)
Labs

## 2020-10-17 NOTE — Patient Instructions (Signed)
  These suggestions will probably help you to improve your metabolism if you are not overweight and to lose weight if you are overweight: 1.  Reduce your consumption of sugars and starches.  Eliminate high fructose corn syrup from your diet.  Reduce your consumption of processed foods.  For desserts try to have seasonal fruits, berries, nuts, cheeses or dark chocolate with more than 70% cacao. 2.  Do not snack 3.  You do not have to eat breakfast.  If you choose to have breakfast - eat plain greek yogurt, eggs, oatmeal (without sugar) - use honey if you need to. 4.  Drink water, freshly brewed unsweetened tea (green, black or herbal) or coffee.  Do not drink sodas including diet sodas , juices, beverages sweetened with artificial sweeteners. 5.  Reduce your consumption of refined grains. 6.  Avoid protein drinks such as Optifast, Slim fast etc. Eat chicken, fish, meat, dairy and beans for your sources of protein. 7.  Natural unprocessed fats like cold pressed virgin olive oil, butter, coconut oil are good for you.  Eat avocados. 8.  Increase your consumption of fiber.  Fruits, berries, vegetables, whole grains, flaxseed, chia seeds, beans, popcorn, nuts, oatmeal are good sources of fiber 9.  Use vinegar in your diet, i.e. apple cider vinegar, red wine or balsamic vinegar 10.  You can try fasting.  For example you can skip breakfast and lunch every other day (24-hour fast) 11.  Stress reduction, good night sleep, relaxation, meditation, yoga and other physical activity is likely to help you to maintain low weight too. 12.  If you drink alcohol, limit your alcohol intake to no more than 2 drinks a day.   Cabbage soup recipe that will not make you gain weight: Take 1 small head of cabbage, 1 average pack of celery, 4 green peppers, 4 onions, 2 cans diced tomatoes (they are not available without salt), salt and spices to taste.  Chop cabbage, celery, peppers and onions.  And tomatoes and 2-2.5 liters  (2.5 quarts) of water so that it would just cover the vegetables.  Bring to boil.  Add spices and salt.  Turn heat to low/medium and simmer for 20-25 minutes.  Naturally, you can make a smaller batch and change some of the ingredients.

## 2020-10-27 ENCOUNTER — Telehealth: Payer: Self-pay | Admitting: *Deleted

## 2020-10-27 NOTE — Telephone Encounter (Signed)
Rec'd fax PA for pt Omega 3-Acid was completed and faxed back to [facility]. Rec'd determination letter stating " The request was denied because you do not meet the requirements for your plan. Your plan approved criteria covers the drug when you have or had the required triglycerides level". Letter has been mailed to patient as well.Marland KitchenJohny Chess

## 2020-10-28 NOTE — Telephone Encounter (Signed)
His triglycerides were 287.  Thanks

## 2021-12-04 ENCOUNTER — Telehealth: Payer: Self-pay

## 2021-12-04 DIAGNOSIS — Z125 Encounter for screening for malignant neoplasm of prostate: Secondary | ICD-10-CM

## 2021-12-04 DIAGNOSIS — Z0000 Encounter for general adult medical examination without abnormal findings: Secondary | ICD-10-CM

## 2021-12-04 DIAGNOSIS — E785 Hyperlipidemia, unspecified: Secondary | ICD-10-CM

## 2021-12-04 DIAGNOSIS — K760 Fatty (change of) liver, not elsewhere classified: Secondary | ICD-10-CM

## 2021-12-04 NOTE — Telephone Encounter (Signed)
Patient requesting lab orders so that labs can be discussed at his visit on 12/29

## 2021-12-04 NOTE — Telephone Encounter (Signed)
Pt has [place] is sch for 12/14/21. Ordered standard cpx labs notified pt labs has order.Made la appt for 12/12/21...Johny Chess

## 2021-12-12 ENCOUNTER — Other Ambulatory Visit (INDEPENDENT_AMBULATORY_CARE_PROVIDER_SITE_OTHER): Payer: No Typology Code available for payment source

## 2021-12-12 ENCOUNTER — Other Ambulatory Visit: Payer: Self-pay

## 2021-12-12 DIAGNOSIS — K760 Fatty (change of) liver, not elsewhere classified: Secondary | ICD-10-CM | POA: Diagnosis not present

## 2021-12-12 DIAGNOSIS — Z0000 Encounter for general adult medical examination without abnormal findings: Secondary | ICD-10-CM | POA: Diagnosis not present

## 2021-12-12 DIAGNOSIS — Z125 Encounter for screening for malignant neoplasm of prostate: Secondary | ICD-10-CM | POA: Diagnosis not present

## 2021-12-12 DIAGNOSIS — E785 Hyperlipidemia, unspecified: Secondary | ICD-10-CM

## 2021-12-12 LAB — CBC WITH DIFFERENTIAL/PLATELET
Basophils Absolute: 0 10*3/uL (ref 0.0–0.1)
Basophils Relative: 0.6 % (ref 0.0–3.0)
Eosinophils Absolute: 0.3 10*3/uL (ref 0.0–0.7)
Eosinophils Relative: 4.3 % (ref 0.0–5.0)
HCT: 45.5 % (ref 39.0–52.0)
Hemoglobin: 15.9 g/dL (ref 13.0–17.0)
Lymphocytes Relative: 34.6 % (ref 12.0–46.0)
Lymphs Abs: 2.2 10*3/uL (ref 0.7–4.0)
MCHC: 34.9 g/dL (ref 30.0–36.0)
MCV: 83.2 fl (ref 78.0–100.0)
Monocytes Absolute: 0.6 10*3/uL (ref 0.1–1.0)
Monocytes Relative: 9.2 % (ref 3.0–12.0)
Neutro Abs: 3.2 10*3/uL (ref 1.4–7.7)
Neutrophils Relative %: 51.3 % (ref 43.0–77.0)
Platelets: 253 10*3/uL (ref 150.0–400.0)
RBC: 5.46 Mil/uL (ref 4.22–5.81)
RDW: 13.5 % (ref 11.5–15.5)
WBC: 6.3 10*3/uL (ref 4.0–10.5)

## 2021-12-12 LAB — LIPID PANEL
Cholesterol: 212 mg/dL — ABNORMAL HIGH (ref 0–200)
HDL: 39.7 mg/dL (ref >39.00)
LDL Cholesterol: 142 mg/dL — ABNORMAL HIGH (ref 0–99)
NonHDL: 171.92
Total CHOL/HDL Ratio: 5
Triglycerides: 149 mg/dL (ref 0.0–149.0)
VLDL: 29.8 mg/dL (ref 0.0–40.0)

## 2021-12-12 LAB — URINALYSIS, ROUTINE W REFLEX MICROSCOPIC
Bilirubin Urine: NEGATIVE
Hgb urine dipstick: NEGATIVE
Ketones, ur: NEGATIVE
Leukocytes,Ua: NEGATIVE
Nitrite: NEGATIVE
RBC / HPF: NONE SEEN (ref 0–?)
Specific Gravity, Urine: 1.025 (ref 1.000–1.030)
Total Protein, Urine: NEGATIVE
Urine Glucose: NEGATIVE
Urobilinogen, UA: 0.2 (ref 0.0–1.0)
WBC, UA: NONE SEEN (ref 0–?)
pH: 6 (ref 5.0–8.0)

## 2021-12-12 LAB — HEPATIC FUNCTION PANEL
ALT: 83 U/L — ABNORMAL HIGH (ref 0–53)
AST: 43 U/L — ABNORMAL HIGH (ref 0–37)
Albumin: 4.6 g/dL (ref 3.5–5.2)
Alkaline Phosphatase: 82 U/L (ref 39–117)
Bilirubin, Direct: 0.1 mg/dL (ref 0.0–0.3)
Total Bilirubin: 0.9 mg/dL (ref 0.2–1.2)
Total Protein: 7.2 g/dL (ref 6.0–8.3)

## 2021-12-12 LAB — BASIC METABOLIC PANEL
BUN: 14 mg/dL (ref 6–23)
CO2: 28 mEq/L (ref 19–32)
Calcium: 9.8 mg/dL (ref 8.4–10.5)
Chloride: 101 mEq/L (ref 96–112)
Creatinine, Ser: 1.05 mg/dL (ref 0.40–1.50)
GFR: 83.61 mL/min (ref >60.00)
Glucose, Bld: 88 mg/dL (ref 70–99)
Potassium: 4.2 mEq/L (ref 3.5–5.1)
Sodium: 139 mEq/L (ref 135–145)

## 2021-12-12 LAB — TSH: TSH: 1.89 u[IU]/mL (ref 0.35–5.50)

## 2021-12-12 LAB — PSA: PSA: 1.84 ng/mL (ref 0.10–4.00)

## 2021-12-14 ENCOUNTER — Ambulatory Visit (INDEPENDENT_AMBULATORY_CARE_PROVIDER_SITE_OTHER): Payer: No Typology Code available for payment source | Admitting: Internal Medicine

## 2021-12-14 ENCOUNTER — Other Ambulatory Visit: Payer: Self-pay

## 2021-12-14 ENCOUNTER — Encounter: Payer: Self-pay | Admitting: Internal Medicine

## 2021-12-14 VITALS — BP 130/86 | HR 96 | Temp 98.2°F | Ht 62.0 in | Wt 180.0 lb

## 2021-12-14 DIAGNOSIS — E669 Obesity, unspecified: Secondary | ICD-10-CM

## 2021-12-14 DIAGNOSIS — K760 Fatty (change of) liver, not elsewhere classified: Secondary | ICD-10-CM | POA: Diagnosis not present

## 2021-12-14 DIAGNOSIS — Z0000 Encounter for general adult medical examination without abnormal findings: Secondary | ICD-10-CM | POA: Diagnosis not present

## 2021-12-14 DIAGNOSIS — R7989 Other specified abnormal findings of blood chemistry: Secondary | ICD-10-CM

## 2021-12-14 MED ORDER — FLUTICASONE-SALMETEROL 100-50 MCG/ACT IN AEPB: 1.0000 | INHALATION_SPRAY | Freq: Two times a day (BID) | RESPIRATORY_TRACT | 3 refills | Status: DC

## 2021-12-14 MED ORDER — OMEGA-3-ACID ETHYL ESTERS 1 G PO CAPS: 2.0000 | ORAL_CAPSULE | Freq: Two times a day (BID) | ORAL | 3 refills | Status: DC

## 2021-12-14 MED ORDER — PHENTERMINE HCL 37.5 MG PO TABS: 37.5000 mg | ORAL_TABLET | Freq: Every day | ORAL | 2 refills | Status: DC

## 2021-12-14 NOTE — Patient Instructions (Signed)
The Obesity Code book by Sharman Cheek   These suggestions will probably help you to improve your metabolism if you are not overweight and to lose weight if you are overweight: 1.  Reduce your consumption of sugars and starches.  Eliminate high fructose corn syrup from your diet.  Reduce your consumption of processed foods.  For desserts try to have seasonal fruits, berries, nuts, cheeses or dark chocolate with more than 70% cacao. 2.  Do not snack 3.  You do not have to eat breakfast.  If you choose to have breakfast - eat plain greek yogurt, eggs, oatmeal (without sugar) - use honey if you need to. 4.  Drink water, freshly brewed unsweetened tea (green, black or herbal) or coffee.  Do not drink sodas including diet sodas , juices, beverages sweetened with artificial sweeteners. 5.  Reduce your consumption of refined grains. 6.  Avoid protein drinks such as Optifast, Slim fast etc. Eat chicken, fish, meat, dairy and beans for your sources of protein. 7.  Natural unprocessed fats like cold pressed virgin olive oil, butter, coconut oil are good for you.  Eat avocados. 8.  Increase your consumption of fiber.  Fruits, berries, vegetables, whole grains, flaxseed, chia seeds, beans, popcorn, nuts, oatmeal are good sources of fiber 9.  Use vinegar in your diet, i.e. apple cider vinegar, red wine or balsamic vinegar 10.  You can try fasting.  For example you can skip breakfast and lunch every other day (24-hour fast) 11.  Stress reduction, good night sleep, relaxation, meditation, yoga and other physical activity is likely to help you to maintain low weight too. 12.  If you drink alcohol, limit your alcohol intake to no more than 2 drinks a day.      Fatty Liver Disease The liver converts food into energy, removes toxic material from the blood, makes important proteins, and absorbs necessary vitamins from food. Fatty liver disease occurs when too much fat has built up in your liver cells. Fatty liver  disease is also called hepatic steatosis. In many cases, fatty liver disease does not cause symptoms or problems. It is often diagnosed when tests are being done for other reasons. However, over time, fatty liver can cause inflammation that may lead to more serious liver problems, such as scarring of the liver (cirrhosis) and liver failure. Fatty liver is associated with insulin resistance, increased body fat, high blood pressure (hypertension), and high cholesterol. These are features of metabolic syndrome and increase your risk for stroke, diabetes, and heart disease. What are the causes? This condition may be caused by components of metabolic syndrome: Obesity. Insulin resistance. High cholesterol. Other causes: Alcohol abuse. Poor nutrition. Cushing syndrome. Pregnancy. Certain drugs. Poisons. Some viral infections. What increases the risk? You are more likely to develop this condition if you: Abuse alcohol. Are overweight. Have diabetes. Have hepatitis. Have a high triglyceride level. Are pregnant. What are the signs or symptoms? Fatty liver disease often does not cause symptoms. If symptoms do develop, they can include: Fatigue and weakness. Weight loss. Confusion. Nausea, vomiting, or abdominal pain. Yellowing of your skin and the white parts of your eyes (jaundice). Itchy skin. How is this diagnosed? This condition may be diagnosed by: A physical exam and your medical history. Blood tests. Imaging tests, such as an ultrasound, CT scan, or MRI. A liver biopsy. A small sample of liver tissue is removed using a needle. The sample is then looked at under a microscope. How is this treated? Fatty liver  disease is often caused by other health conditions. Treatment for fatty liver may involve medicines and lifestyle changes to manage conditions such as: Alcoholism. High cholesterol. Diabetes. Being overweight or obese. Follow these instructions at home:  Do not drink  alcohol. If you have trouble quitting, ask your health care provider how to safely quit with the help of medicine or a supervised program. This is important to keep your condition from getting worse. Eat a healthy diet as told by your health care provider. Ask your health care provider about working with a dietitian to develop an eating plan. Exercise regularly. This can help you lose weight and control your cholesterol and diabetes. Talk to your health care provider about an exercise plan and which activities are best for you. Take over-the-counter and prescription medicines only as told by your health care provider. Keep all follow-up visits. This is important. Contact a health care provider if: You have trouble controlling your: Blood sugar. This is especially important if you have diabetes. Cholesterol. Drinking of alcohol. Get help right away if: You have abdominal pain. You have jaundice. You have nausea and are vomiting. You vomit blood or material that looks like coffee grounds. You have stools that are black, tar-like, or bloody. Summary Fatty liver disease develops when too much fat builds up in the cells of your liver. Fatty liver disease often causes no symptoms or problems. However, over time, fatty liver can cause inflammation that may lead to more serious liver problems, such as scarring of the liver (cirrhosis). You are more likely to develop this condition if you abuse alcohol, are pregnant, are overweight, have diabetes, have hepatitis, or have high triglyceride or cholesterol levels. Contact your health care provider if you have trouble controlling your blood sugar, cholesterol, or drinking of alcohol. This information is not intended to replace advice given to you by your health care provider. Make sure you discuss any questions you have with your health care provider. Document Revised: 09/15/2020 Document Reviewed: 09/15/2020 Elsevier Patient Education  [place].

## 2021-12-14 NOTE — Progress Notes (Signed)
Subjective:  Patient ID: [name], male    DOB: 1971/01/18  Age: 50 y.o. MRN: [id number]  CC: No chief complaint on file.   HPI Junius Faucett presents for a well exam  Outpatient Medications Prior to Visit  Medication Sig Dispense Refill   Cholecalciferol 25 MCG (1000 UT) tablet Take by mouth.     triamcinolone ointment (KENALOG) 0.1 % Apply 1 application topically 3 (three) times daily. 80 g 2   Fluticasone-Salmeterol (ADVAIR DISKUS) 100-50 MCG/DOSE AEPB Inhale 1 puff into the lungs 2 (two) times daily. 1 each 5   omega-3 acid ethyl esters (LOVAZA) 1 g capsule Take 2 capsules (2 g total) by mouth 2 (two) times daily. Annual appt due in Nov must see provider for future refills 360 capsule 3   No facility-administered medications prior to visit.    ROS: Review of Systems  Constitutional:  Negative for appetite change, fatigue and unexpected weight change.  HENT:  Negative for congestion, nosebleeds, sneezing, sore throat and trouble swallowing.   Eyes:  Negative for itching and visual disturbance.  Respiratory:  Negative for cough.   Cardiovascular:  Negative for chest pain, palpitations and leg swelling.  Gastrointestinal:  Negative for abdominal distention, blood in stool, diarrhea and nausea.  Genitourinary:  Negative for frequency and hematuria.  Musculoskeletal:  Negative for back pain, gait problem, joint swelling and neck pain.  Skin:  Negative for rash.  Neurological:  Negative for dizziness, tremors, speech difficulty and weakness.  Psychiatric/Behavioral:  Negative for agitation, dysphoric mood and sleep disturbance. The patient is not nervous/anxious.    Objective:  BP 130/86 (BP Location: Left Arm, Patient Position: Sitting, Cuff Size: Large)    Pulse 96    Temp 98.2 F (36.8 C) (Oral)    Ht 5\' 2"  (1.575 m)    Wt 180 lb (81.6 kg)    SpO2 98%    BMI 32.92 kg/m   BP Readings from Last 3 Encounters:  12/14/21 130/86  10/17/20 128/82  09/01/19 132/78    Wt  Readings from Last 3 Encounters:  12/14/21 180 lb (81.6 kg)  10/17/20 184 lb 6.4 oz (83.6 kg)  09/01/19 177 lb (80.3 kg)    Physical Exam Constitutional:      General: He is not in acute distress.    Appearance: Normal appearance. He is well-developed.     Comments: NAD  Eyes:     Conjunctiva/sclera: Conjunctivae normal.     Pupils: Pupils are equal, round, and reactive to light.  Neck:     Thyroid: No thyromegaly.     Vascular: No JVD.  Cardiovascular:     Rate and Rhythm: Normal rate and regular rhythm.     Heart sounds: Normal heart sounds. No murmur heard.   No friction rub. No gallop.  Pulmonary:     Effort: Pulmonary effort is normal. No respiratory distress.     Breath sounds: Normal breath sounds. No wheezing or rales.  Chest:     Chest wall: No tenderness.  Abdominal:     General: Bowel sounds are normal. There is no distension.     Palpations: Abdomen is soft. There is no mass.     Tenderness: There is no abdominal tenderness. There is no guarding or rebound.  Musculoskeletal:        General: No tenderness. Normal range of motion.     Cervical back: Normal range of motion.  Lymphadenopathy:     Cervical: No cervical adenopathy.  Skin:  General: Skin is warm and dry.     Findings: No rash.  Neurological:     Mental Status: He is alert and oriented to person, place, and time.     Cranial Nerves: No cranial nerve deficit.     Motor: No abnormal muscle tone.     Coordination: Coordination normal.     Gait: Gait normal.     Deep Tendon Reflexes: Reflexes are normal and symmetric.  Psychiatric:        Behavior: Behavior normal.        Thought Content: Thought content normal.        Judgment: Judgment normal.    Lab Results  Component Value Date   WBC 6.3 12/12/2021   HGB 15.9 12/12/2021   HCT 45.5 12/12/2021   PLT 253.0 12/12/2021   GLUCOSE 88 12/12/2021   CHOL 212 (H) 12/12/2021   TRIG 149.0 12/12/2021   HDL 39.70 12/12/2021   LDLDIRECT 134.0  10/14/2020   LDLCALC 142 (H) 12/12/2021   ALT 83 (H) 12/12/2021   AST 43 (H) 12/12/2021   NA 139 12/12/2021   K 4.2 12/12/2021   CL 101 12/12/2021   CREATININE 1.05 12/12/2021   BUN 14 12/12/2021   CO2 28 12/12/2021   TSH 1.89 12/12/2021   PSA 1.84 12/12/2021   INR 1.1 (H) 03/06/2016    No results found.  Assessment & Plan:   Problem List Items Addressed This Visit     Fatty liver    Chronic.  Persistent mild LFT elevation Obtain liver US Start phentermine to assist with weight loss      Relevant Orders   US Abdomen Limited RUQ (LIVER/GB)   Obesity (BMI 30.0-34.9)    BMI 32 Start phentermine to assist with weight loss -we need to improve his fatty liver condition.  Potential benefits of a short term phentermine use as well as potential risks  and complications were explained to the patient and were aknowledged.      Well adult exam     We discussed age appropriate health related issues, including available/recomended screening tests and vaccinations. Labs were ordered to be later reviewed . All questions were answered. We discussed one or more of the following - seat belt use, use of sunscreen/sun exposure exercise, safe sex, fall risk reduction, second hand smoke exposure, firearm use and storage, seat belt use, a need for adhering to healthy diet and exercise. Labs were ordered.  All questions were answered.      Other Visit Diagnoses     Elevated LFTs    -  Primary   Relevant Orders   US Abdomen Limited RUQ (LIVER/GB)   Comprehensive metabolic panel         Meds ordered this encounter  Medications   omega-3 acid ethyl esters (LOVAZA) 1 g capsule    Sig: Take 2 capsules (2 g total) by mouth 2 (two) times daily. Annual appt due in Nov must see provider for future refills    Dispense:  360 capsule    Refill:  3   fluticasone-salmeterol (ADVAIR) 100-50 MCG/ACT AEPB    Sig: Inhale 1 puff into the lungs 2 (two) times daily.    Dispense:  1 each    Refill:  3    phentermine (ADIPEX-P) 37.5 MG tablet    Sig: Take 1 tablet (37.5 mg total) by mouth daily before breakfast.    Dispense:  30 tablet    Refill:  2      Follow-up:  Return in about 3 months (around 03/14/2022) for a follow-up visit.  Walker Kehr, MD

## 2021-12-14 NOTE — Assessment & Plan Note (Addendum)
Chronic.  Persistent mild LFT elevation Obtain liver US Start phentermine to assist with weight loss

## 2021-12-15 ENCOUNTER — Other Ambulatory Visit: Payer: Self-pay | Admitting: Internal Medicine

## 2021-12-16 DIAGNOSIS — E669 Obesity, unspecified: Secondary | ICD-10-CM | POA: Insufficient documentation

## 2021-12-16 NOTE — Assessment & Plan Note (Signed)

## 2021-12-16 NOTE — Assessment & Plan Note (Signed)
BMI 32 Start phentermine to assist with weight loss -we need to improve his fatty liver condition.  Potential benefits of a short term phentermine use as well as potential risks  and complications were explained to the patient and were aknowledged.

## 2022-01-03 ENCOUNTER — Ambulatory Visit
Admission: RE | Admit: 2022-01-03 | Discharge: 2022-01-03 | Disposition: A | Discharge status: Home / Self Care | Payer: No Typology Code available for payment source | Source: Ambulatory Visit | Attending: Internal Medicine | Admitting: Internal Medicine

## 2022-01-03 ENCOUNTER — Other Ambulatory Visit: Payer: Self-pay

## 2022-01-03 DIAGNOSIS — K760 Fatty (change of) liver, not elsewhere classified: Secondary | ICD-10-CM

## 2022-01-03 DIAGNOSIS — R7989 Other specified abnormal findings of blood chemistry: Secondary | ICD-10-CM

## 2022-03-12 ENCOUNTER — Other Ambulatory Visit: Payer: Self-pay | Admitting: Internal Medicine

## 2022-03-14 ENCOUNTER — Ambulatory Visit: Payer: No Typology Code available for payment source | Admitting: Internal Medicine

## 2022-03-14 ENCOUNTER — Other Ambulatory Visit (INDEPENDENT_AMBULATORY_CARE_PROVIDER_SITE_OTHER): Payer: No Typology Code available for payment source

## 2022-03-14 DIAGNOSIS — R7989 Other specified abnormal findings of blood chemistry: Secondary | ICD-10-CM | POA: Diagnosis not present

## 2022-03-14 LAB — COMPREHENSIVE METABOLIC PANEL
ALT: 29 U/L (ref 0–53)
AST: 21 U/L (ref 0–37)
Albumin: 4.8 g/dL (ref 3.5–5.2)
Alkaline Phosphatase: 78 U/L (ref 39–117)
BUN: 8 mg/dL (ref 6–23)
CO2: 31 mEq/L (ref 19–32)
Calcium: 10 mg/dL (ref 8.4–10.5)
Chloride: 102 mEq/L (ref 96–112)
Creatinine, Ser: 0.94 mg/dL (ref 0.40–1.50)
GFR: 95.32 mL/min (ref >60.00)
Glucose, Bld: 97 mg/dL (ref 70–99)
Potassium: 4 mEq/L (ref 3.5–5.1)
Sodium: 139 mEq/L (ref 135–145)
Total Bilirubin: 0.7 mg/dL (ref 0.2–1.2)
Total Protein: 7.6 g/dL (ref 6.0–8.3)

## 2022-03-27 ENCOUNTER — Ambulatory Visit: Payer: No Typology Code available for payment source | Admitting: Internal Medicine

## 2022-03-27 ENCOUNTER — Encounter: Payer: Self-pay | Admitting: Internal Medicine

## 2022-03-27 VITALS — BP 120/80 | HR 77 | Temp 98.2°F | Ht 62.0 in | Wt 159.0 lb

## 2022-03-27 DIAGNOSIS — E669 Obesity, unspecified: Secondary | ICD-10-CM | POA: Diagnosis not present

## 2022-03-27 DIAGNOSIS — E785 Hyperlipidemia, unspecified: Secondary | ICD-10-CM | POA: Diagnosis not present

## 2022-03-27 LAB — COMPREHENSIVE METABOLIC PANEL
ALT: 62 U/L — ABNORMAL HIGH (ref 0–53)
AST: 30 U/L (ref 0–37)
Albumin: 4.7 g/dL (ref 3.5–5.2)
Alkaline Phosphatase: 88 U/L (ref 39–117)
BUN: 10 mg/dL (ref 6–23)
CO2: 30 mEq/L (ref 19–32)
Calcium: 10 mg/dL (ref 8.4–10.5)
Chloride: 102 mEq/L (ref 96–112)
Creatinine, Ser: 0.86 mg/dL (ref 0.40–1.50)
GFR: 101.79 mL/min (ref >60.00)
Glucose, Bld: 80 mg/dL (ref 70–99)
Potassium: 3.9 mEq/L (ref 3.5–5.1)
Sodium: 139 mEq/L (ref 135–145)
Total Bilirubin: 0.9 mg/dL (ref 0.2–1.2)
Total Protein: 7.3 g/dL (ref 6.0–8.3)

## 2022-03-27 LAB — LIPID PANEL
Cholesterol: 181 mg/dL (ref 0–200)
HDL: 40 mg/dL (ref >39.00)
LDL Cholesterol: 123 mg/dL — ABNORMAL HIGH (ref 0–99)
NonHDL: 141.26
Total CHOL/HDL Ratio: 5
Triglycerides: 92 mg/dL (ref 0.0–149.0)
VLDL: 18.4 mg/dL (ref 0.0–40.0)

## 2022-03-27 NOTE — Assessment & Plan Note (Signed)
Start phentermine to assist with weight loss -we need to improve his fatty liver condition. ? Potential benefits of a short term phentermine use as well as potential risks  and complications were explained to the patient and were aknowledged. ?

## 2022-03-27 NOTE — Progress Notes (Signed)
? ?Subjective:  ?Patient ID: [name], male    DOB: [birth year]  Age: 51 y.o. MRN: [id number] ? ?CC: No chief complaint on file. ? ? ?HPI ?[name] presents for obesity and elevated LFT ? ?Outpatient Medications Prior to Visit  ?Medication Sig Dispense Refill  ? Cholecalciferol 25 MCG (1000 UT) tablet Take by mouth.    ? omega-3 acid ethyl esters (LOVAZA) 1 g capsule Take 2 capsules (2 g total) by mouth 2 (two) times daily. Annual appt due in Nov must see provider for future refills 360 capsule 3  ? phentermine (ADIPEX-P) 37.5 MG tablet TAKE ONE TABLET BY MOUTH DAILY BEFORE BREAKFAST 30 tablet 2  ? fluticasone-salmeterol (ADVAIR) 100-50 MCG/ACT AEPB Inhale 1 puff into the lungs 2 (two) times daily. 1 each 3  ? triamcinolone ointment (KENALOG) 0.1 % Apply 1 application topically 3 (three) times daily. 80 g 2  ? ?No facility-administered medications prior to visit.  ? ? ?ROS: ?Review of Systems ? ?Objective:  ?BP 120/80 (BP Location: Left Arm, Patient Position: Sitting, Cuff Size: Large)   Pulse 77   Temp 98.2 ?F (36.8 ?C) (Oral)   Ht '5\' 2"'$  (1.575 m)   Wt 159 lb (72.1 kg)   SpO2 99%   BMI 29.08 kg/m?  ? ?BP Readings from Last 3 Encounters:  ?03/27/22 120/80  ?12/14/21 130/86  ?10/17/20 128/82  ? ? ?Wt Readings from Last 3 Encounters:  ?03/27/22 159 lb (72.1 kg)  ?12/14/21 180 lb (81.6 kg)  ?10/17/20 184 lb 6.4 oz (83.6 kg)  ? ? ?Physical Exam ? ?Lab Results  ?Component Value Date  ? WBC 6.3 12/12/2021  ? HGB 15.9 12/12/2021  ? HCT 45.5 12/12/2021  ? PLT 253.0 12/12/2021  ? GLUCOSE 97 03/14/2022  ? CHOL 212 (H) 12/12/2021  ? TRIG 149.0 12/12/2021  ? HDL 39.70 12/12/2021  ? LDLDIRECT 134.0 10/14/2020  ? LDLCALC 142 (H) 12/12/2021  ? ALT 29 03/14/2022  ? AST 21 03/14/2022  ? NA 139 03/14/2022  ? K 4.0 03/14/2022  ? CL 102 03/14/2022  ? CREATININE 0.94 03/14/2022  ? BUN 8 03/14/2022  ? CO2 31 03/14/2022  ? TSH 1.89 12/12/2021  ? PSA 1.84 12/12/2021  ? INR 1.1 (H) 03/06/2016  ? ? ?US Abdomen Limited RUQ  (LIVER/GB) ? ?Result Date: 01/03/2022 ?CLINICAL DATA:  Abnormal liver function tests EXAM: ULTRASOUND ABDOMEN LIMITED RIGHT UPPER QUADRANT COMPARISON:  None. FINDINGS: Gallbladder: No gallstones or wall thickening visualized. No sonographic Murphy sign noted by sonographer. Common bile duct: Diameter: 3.6 mm Liver: There is increased echogenicity in the liver. No focal abnormality is seen in the visualized portions of liver. Portal vein is patent on color Doppler imaging with normal direction of blood flow towards the liver. Other: None. IMPRESSION: Fatty liver. No other sonographic abnormality is seen in the right upper quadrant. Electronically Signed   By: Elmer Picker M.D.   On: 01/03/2022 14:46  ? ? ?Assessment & Plan:  ? ?Problem List Items Addressed This Visit   ? ? Dyslipidemia - Primary  ? Relevant Orders  ? Lipid panel  ? Comprehensive metabolic panel  ? Obesity (BMI 30.0-34.9)  ?  Start phentermine to assist with weight loss -we need to improve his fatty liver condition. ? Potential benefits of a short term phentermine use as well as potential risks  and complications were explained to the patient and were aknowledged. ?  ?  ?  ? ? ?No orders of the defined types were placed in  this encounter. ?  ? ? ?Follow-up: Return in about 3 months (around 06/26/2022) for a follow-up visit. ? ?Walker Kehr, MD ?

## 2022-06-26 ENCOUNTER — Ambulatory Visit: Payer: No Typology Code available for payment source | Admitting: Internal Medicine

## 2022-06-26 ENCOUNTER — Encounter: Payer: Self-pay | Admitting: Internal Medicine

## 2022-06-26 VITALS — BP 120/80 | HR 72 | Temp 97.8°F | Ht 62.0 in | Wt 155.0 lb

## 2022-06-26 DIAGNOSIS — K760 Fatty (change of) liver, not elsewhere classified: Secondary | ICD-10-CM | POA: Diagnosis not present

## 2022-06-26 DIAGNOSIS — E669 Obesity, unspecified: Secondary | ICD-10-CM | POA: Diagnosis not present

## 2022-06-26 DIAGNOSIS — E785 Hyperlipidemia, unspecified: Secondary | ICD-10-CM

## 2022-06-26 LAB — COMPREHENSIVE METABOLIC PANEL
ALT: 22 U/L (ref 0–53)
AST: 17 U/L (ref 0–37)
Albumin: 4.6 g/dL (ref 3.5–5.2)
Alkaline Phosphatase: 68 U/L (ref 39–117)
BUN: 9 mg/dL (ref 6–23)
CO2: 29 mEq/L (ref 19–32)
Calcium: 9.6 mg/dL (ref 8.4–10.5)
Chloride: 101 mEq/L (ref 96–112)
Creatinine, Ser: 0.97 mg/dL (ref 0.40–1.50)
GFR: 91.61 mL/min (ref >60.00)
Glucose, Bld: 86 mg/dL (ref 70–99)
Potassium: 3.9 mEq/L (ref 3.5–5.1)
Sodium: 138 mEq/L (ref 135–145)
Total Bilirubin: 1 mg/dL (ref 0.2–1.2)
Total Protein: 7.2 g/dL (ref 6.0–8.3)

## 2022-06-26 LAB — LIPID PANEL
Cholesterol: 191 mg/dL (ref 0–200)
HDL: 33.8 mg/dL — ABNORMAL LOW (ref >39.00)
LDL Cholesterol: 135 mg/dL — ABNORMAL HIGH (ref 0–99)
NonHDL: 157.35
Total CHOL/HDL Ratio: 6
Triglycerides: 112 mg/dL (ref 0.0–149.0)
VLDL: 22.4 mg/dL (ref 0.0–40.0)

## 2022-06-26 MED ORDER — PHENTERMINE HCL 37.5 MG PO TABS: 37.5000 mg | ORAL_TABLET | Freq: Every day | ORAL | 2 refills | Status: DC

## 2022-06-26 NOTE — Assessment & Plan Note (Signed)
Check LFTs 

## 2022-06-26 NOTE — Assessment & Plan Note (Signed)
Beter Cont w/phentermine to assist with weight loss -we need to improve his fatty liver condition.  Potential benefits of a short term phentermine use as well as potential risks  and complications were explained to the patient and were aknowledged

## 2022-06-26 NOTE — Progress Notes (Signed)
Subjective:  Patient ID: [name], male    DOB: 02/26/1971  Age: 51 y.o. MRN: [id number]  CC: No chief complaint on file.   HPI [name] presents for off.  She is issues.    Outpatient Medications Prior to Visit  Medication Sig Dispense Refill   Cholecalciferol 25 MCG (1000 UT) tablet Take by mouth.     omega-3 acid ethyl esters (LOVAZA) 1 g capsule Take 2 capsules (2 g total) by mouth 2 (two) times daily. Annual appt due in Nov must see provider for future refills 360 capsule 3   phentermine (ADIPEX-P) 37.5 MG tablet TAKE ONE TABLET BY MOUTH DAILY BEFORE BREAKFAST 30 tablet 2   No facility-administered medications prior to visit.    ROS: Review of Systems  Constitutional:  Negative for appetite change, fatigue and unexpected weight change.  HENT:  Negative for congestion, nosebleeds, sneezing, sore throat and trouble swallowing.   Eyes:  Negative for itching and visual disturbance.  Respiratory:  Negative for cough.   Cardiovascular:  Negative for chest pain, palpitations and leg swelling.  Gastrointestinal:  Negative for abdominal distention, blood in stool, diarrhea and nausea.  Genitourinary:  Negative for frequency and hematuria.  Musculoskeletal:  Negative for back pain, gait problem, joint swelling and neck pain.  Skin:  Negative for rash.  Neurological:  Negative for dizziness, tremors, speech difficulty and weakness.  Psychiatric/Behavioral:  Negative for agitation, dysphoric mood and sleep disturbance. The patient is not nervous/anxious.     Objective:  BP 120/80 (BP Location: Left Arm, Patient Position: Sitting, Cuff Size: Normal)   Pulse 72   Temp 97.8 F (36.6 C) (Oral)   Ht '5\' 2"'$  (1.575 m)   Wt 155 lb (70.3 kg)   SpO2 98%   BMI 28.35 kg/m   BP Readings from Last 3 Encounters:  06/26/22 120/80  03/27/22 120/80  12/14/21 130/86    Wt Readings from Last 3 Encounters:  06/26/22 155 lb (70.3 kg)  03/27/22 159 lb (72.1 kg)  12/14/21 180 lb  (81.6 kg)    Physical Exam Constitutional:      General: He is not in acute distress.    Appearance: He is well-developed.     Comments: NAD  Eyes:     Conjunctiva/sclera: Conjunctivae normal.     Pupils: Pupils are equal, round, and reactive to light.  Neck:     Thyroid: No thyromegaly.     Vascular: No JVD.  Cardiovascular:     Rate and Rhythm: Normal rate and regular rhythm.     Heart sounds: Normal heart sounds. No murmur heard.    No friction rub. No gallop.  Pulmonary:     Effort: Pulmonary effort is normal. No respiratory distress.     Breath sounds: Normal breath sounds. No wheezing or rales.  Chest:     Chest wall: No tenderness.  Abdominal:     General: Bowel sounds are normal. There is no distension.     Palpations: Abdomen is soft. There is no mass.     Tenderness: There is no abdominal tenderness. There is no guarding or rebound.  Musculoskeletal:        General: No tenderness. Normal range of motion.     Cervical back: Normal range of motion.  Lymphadenopathy:     Cervical: No cervical adenopathy.  Skin:    General: Skin is warm and dry.     Findings: No rash.  Neurological:     Mental Status: He is alert  and oriented to person, place, and time.     Cranial Nerves: No cranial nerve deficit.     Motor: No abnormal muscle tone.     Coordination: Coordination normal.     Gait: Gait normal.     Deep Tendon Reflexes: Reflexes are normal and symmetric.  Psychiatric:        Behavior: Behavior normal.        Thought Content: Thought content normal.        Judgment: Judgment normal.     Lab Results  Component Value Date   WBC 6.3 12/12/2021   HGB 15.9 12/12/2021   HCT 45.5 12/12/2021   PLT 253.0 12/12/2021   GLUCOSE 80 03/27/2022   CHOL 181 03/27/2022   TRIG 92.0 03/27/2022   HDL 40.00 03/27/2022   LDLDIRECT 134.0 10/14/2020   LDLCALC 123 (H) 03/27/2022   ALT 62 (H) 03/27/2022   AST 30 03/27/2022   NA 139 03/27/2022   K 3.9 03/27/2022   CL 102  03/27/2022   CREATININE 0.86 03/27/2022   BUN 10 03/27/2022   CO2 30 03/27/2022   TSH 1.89 12/12/2021   PSA 1.84 12/12/2021   INR 1.1 (H) 03/06/2016    US Abdomen Limited RUQ (LIVER/GB)  Result Date: 01/03/2022 CLINICAL DATA:  Abnormal liver function tests EXAM: ULTRASOUND ABDOMEN LIMITED RIGHT UPPER QUADRANT COMPARISON:  None. FINDINGS: Gallbladder: No gallstones or wall thickening visualized. No sonographic Murphy sign noted by sonographer. Common bile duct: Diameter: 3.6 mm Liver: There is increased echogenicity in the liver. No focal abnormality is seen in the visualized portions of liver. Portal vein is patent on color Doppler imaging with normal direction of blood flow towards the liver. Other: None. IMPRESSION: Fatty liver. No other sonographic abnormality is seen in the right upper quadrant. Electronically Signed   By: [name] M.D.   On: 01/03/2022 14:46    Assessment & Plan:   Problem List Items Addressed This Visit   None     Meds ordered this encounter  Medications   phentermine (ADIPEX-P) 37.5 MG tablet    Sig: Take 1 tablet (37.5 mg total) by mouth daily before breakfast.    Dispense:  30 tablet    Refill:  2      Follow-up: Return in about 4 months (around 10/27/2022) for a follow-up visit.  Walker Kehr, MD

## 2022-09-26 ENCOUNTER — Encounter: Payer: Self-pay | Admitting: Internal Medicine

## 2022-09-26 ENCOUNTER — Ambulatory Visit: Payer: No Typology Code available for payment source | Admitting: Internal Medicine

## 2022-09-26 DIAGNOSIS — E669 Obesity, unspecified: Secondary | ICD-10-CM

## 2022-09-26 DIAGNOSIS — E785 Hyperlipidemia, unspecified: Secondary | ICD-10-CM

## 2022-09-26 MED ORDER — PHENTERMINE HCL 37.5 MG PO TABS: 37.5000 mg | ORAL_TABLET | Freq: Every day | ORAL | 2 refills | Status: DC

## 2022-09-26 NOTE — Progress Notes (Signed)
Subjective:  Patient ID: [name], male    DOB: June 26, 1971  Age: 51 y.o. MRN: [id number]  CC: Follow-up (3 month f/u)   HPI [name] presents for dyslipidemia, obesity  Outpatient Medications Prior to Visit  Medication Sig Dispense Refill   Cholecalciferol 25 MCG (1000 UT) tablet Take by mouth.     omega-3 acid ethyl esters (LOVAZA) 1 g capsule Take 2 capsules (2 g total) by mouth 2 (two) times daily. Annual appt due in Nov must see provider for future refills 360 capsule 3   phentermine (ADIPEX-P) 37.5 MG tablet Take 1 tablet (37.5 mg total) by mouth daily before breakfast. 30 tablet 2   No facility-administered medications prior to visit.    ROS: Review of Systems  Constitutional:  Negative for appetite change, fatigue and unexpected weight change.  HENT:  Negative for congestion, nosebleeds, sneezing, sore throat and trouble swallowing.   Eyes:  Negative for itching and visual disturbance.  Respiratory:  Negative for cough.   Cardiovascular:  Negative for chest pain, palpitations and leg swelling.  Gastrointestinal:  Negative for abdominal distention, blood in stool, diarrhea and nausea.  Genitourinary:  Negative for frequency and hematuria.  Musculoskeletal:  Negative for back pain, gait problem, joint swelling and neck pain.  Skin:  Negative for rash.  Neurological:  Negative for dizziness, tremors, speech difficulty and weakness.  Psychiatric/Behavioral:  Negative for agitation, dysphoric mood and sleep disturbance. The patient is not nervous/anxious.     Objective:  BP 120/72 (BP Location: Left Arm)   Pulse 75   Temp 98.8 F (37.1 C) (Oral)   Ht '5\' 2"'$  (1.575 m)   Wt 151 lb 12.8 oz (68.9 kg)   SpO2 98%   BMI 27.76 kg/m   BP Readings from Last 3 Encounters:  09/26/22 120/72  06/26/22 120/80  03/27/22 120/80    Wt Readings from Last 3 Encounters:  09/26/22 151 lb 12.8 oz (68.9 kg)  06/26/22 155 lb (70.3 kg)  03/27/22 159 lb (72.1 kg)    Physical  Exam Constitutional:      General: He is not in acute distress.    Appearance: He is well-developed.     Comments: NAD  Eyes:     Conjunctiva/sclera: Conjunctivae normal.     Pupils: Pupils are equal, round, and reactive to light.  Neck:     Thyroid: No thyromegaly.     Vascular: No JVD.  Cardiovascular:     Rate and Rhythm: Normal rate and regular rhythm.     Heart sounds: Normal heart sounds. No murmur heard.    No friction rub. No gallop.  Pulmonary:     Effort: Pulmonary effort is normal. No respiratory distress.     Breath sounds: Normal breath sounds. No wheezing or rales.  Chest:     Chest wall: No tenderness.  Abdominal:     General: Bowel sounds are normal. There is no distension.     Palpations: Abdomen is soft. There is no mass.     Tenderness: There is no abdominal tenderness. There is no guarding or rebound.  Musculoskeletal:        General: No tenderness. Normal range of motion.     Cervical back: Normal range of motion.  Lymphadenopathy:     Cervical: No cervical adenopathy.  Skin:    General: Skin is warm and dry.     Findings: No rash.  Neurological:     Mental Status: He is alert and oriented to person, place,  and time.     Cranial Nerves: No cranial nerve deficit.     Motor: No abnormal muscle tone.     Coordination: Coordination normal.     Gait: Gait normal.     Deep Tendon Reflexes: Reflexes are normal and symmetric.  Psychiatric:        Behavior: Behavior normal.        Thought Content: Thought content normal.        Judgment: Judgment normal.     Lab Results  Component Value Date   WBC 6.3 12/12/2021   HGB 15.9 12/12/2021   HCT 45.5 12/12/2021   PLT 253.0 12/12/2021   GLUCOSE 86 06/26/2022   CHOL 191 06/26/2022   TRIG 112.0 06/26/2022   HDL 33.80 (L) 06/26/2022   LDLDIRECT 134.0 10/14/2020   LDLCALC 135 (H) 06/26/2022   ALT 22 06/26/2022   AST 17 06/26/2022   NA 138 06/26/2022   K 3.9 06/26/2022   CL 101 06/26/2022   CREATININE  0.97 06/26/2022   BUN 9 06/26/2022   CO2 29 06/26/2022   TSH 1.89 12/12/2021   PSA 1.84 12/12/2021   INR 1.1 (H) 03/06/2016    US Abdomen Limited RUQ (LIVER/GB)  Result Date: 01/03/2022 CLINICAL DATA:  Abnormal liver function tests EXAM: ULTRASOUND ABDOMEN LIMITED RIGHT UPPER QUADRANT COMPARISON:  None. FINDINGS: Gallbladder: No gallstones or wall thickening visualized. No sonographic Murphy sign noted by sonographer. Common bile duct: Diameter: 3.6 mm Liver: There is increased echogenicity in the liver. No focal abnormality is seen in the visualized portions of liver. Portal vein is patent on color Doppler imaging with normal direction of blood flow towards the liver. Other: None. IMPRESSION: Fatty liver. No other sonographic abnormality is seen in the right upper quadrant. Electronically Signed   By: Elmer Picker M.D.   On: 01/03/2022 14:46    Assessment & Plan:   Problem List Items Addressed This Visit     Dyslipidemia    Cont on Lovaza       Obesity (BMI 30.0-34.9)    Cont on  phentermine to assist with weight loss -we need to improve his fatty liver condition. Potential benefits of a short term phentermine use as well as potential risks  and complications were explained to the patient and were aknowledged.         Meds ordered this encounter  Medications   phentermine (ADIPEX-P) 37.5 MG tablet    Sig: Take 1 tablet (37.5 mg total) by mouth daily before breakfast.    Dispense:  30 tablet    Refill:  2      Follow-up: Return in about 3 months (around 12/27/2022) for a follow-up visit.  Walker Kehr, MD

## 2022-09-26 NOTE — Assessment & Plan Note (Signed)
Cont on Lovaza

## 2022-09-26 NOTE — Assessment & Plan Note (Signed)
Cont on  phentermine to assist with weight loss -we need to improve his fatty liver condition. Potential benefits of a short term phentermine use as well as potential risks  and complications were explained to the patient and were aknowledged.

## 2022-12-27 ENCOUNTER — Ambulatory Visit: Payer: No Typology Code available for payment source | Admitting: Internal Medicine

## 2022-12-27 ENCOUNTER — Encounter: Payer: Self-pay | Admitting: Internal Medicine

## 2022-12-27 VITALS — BP 122/78 | HR 72 | Temp 98.1°F | Ht 62.0 in | Wt 155.0 lb

## 2022-12-27 DIAGNOSIS — M79606 Pain in leg, unspecified: Secondary | ICD-10-CM | POA: Insufficient documentation

## 2022-12-27 DIAGNOSIS — M79605 Pain in left leg: Secondary | ICD-10-CM | POA: Diagnosis not present

## 2022-12-27 DIAGNOSIS — K760 Fatty (change of) liver, not elsewhere classified: Secondary | ICD-10-CM

## 2022-12-27 DIAGNOSIS — E669 Obesity, unspecified: Secondary | ICD-10-CM | POA: Diagnosis not present

## 2022-12-27 DIAGNOSIS — E785 Hyperlipidemia, unspecified: Secondary | ICD-10-CM

## 2022-12-27 MED ORDER — PHENTERMINE HCL 37.5 MG PO TABS: 37.5000 mg | ORAL_TABLET | Freq: Every day | ORAL | 2 refills | Status: DC

## 2022-12-27 MED ORDER — OMEGA-3-ACID ETHYL ESTERS 1 G PO CAPS: 2.0000 | ORAL_CAPSULE | Freq: Two times a day (BID) | ORAL | 3 refills | Status: DC

## 2022-12-27 NOTE — Assessment & Plan Note (Addendum)
Meralgia paresthetica (MP) discussed.  Recommendations were made

## 2022-12-27 NOTE — Progress Notes (Signed)
Subjective:  Patient ID: [name], male    DOB: 05/11/1971  Age: 52 y.o. MRN: [id number]  CC: Follow-up   HPI Milferd Ansell presents for dyslipidemia C/o outer L leg pain when sitting - numbness Follow-up on hepatic steatosis  Outpatient Medications Prior to Visit  Medication Sig Dispense Refill   Cholecalciferol 25 MCG (1000 UT) tablet Take by mouth.     omega-3 acid ethyl esters (LOVAZA) 1 g capsule Take 2 capsules (2 g total) by mouth 2 (two) times daily. Annual appt due in Nov must see provider for future refills 360 capsule 3   phentermine (ADIPEX-P) 37.5 MG tablet Take 1 tablet (37.5 mg total) by mouth daily before breakfast. 30 tablet 2   No facility-administered medications prior to visit.    ROS: Review of Systems  Constitutional:  Negative for appetite change, fatigue and unexpected weight change.  HENT:  Negative for congestion, nosebleeds, sneezing, sore throat and trouble swallowing.   Eyes:  Negative for itching and visual disturbance.  Respiratory:  Negative for cough.   Cardiovascular:  Negative for chest pain, palpitations and leg swelling.  Gastrointestinal:  Negative for abdominal distention, blood in stool, diarrhea and nausea.  Genitourinary:  Negative for frequency and hematuria.  Musculoskeletal:  Negative for back pain, gait problem, joint swelling and neck pain.  Skin:  Negative for rash.  Neurological:  Negative for dizziness, tremors, speech difficulty and weakness.  Psychiatric/Behavioral:  Negative for agitation, dysphoric mood and sleep disturbance. The patient is not nervous/anxious.     Objective:  BP 122/78 (BP Location: Left Arm, Patient Position: Sitting, Cuff Size: Normal)   Pulse 72   Temp 98.1 F (36.7 C) (Oral)   Ht '5\' 2"'$  (1.575 m)   Wt 155 lb (70.3 kg)   SpO2 98%   BMI 28.35 kg/m   BP Readings from Last 3 Encounters:  12/27/22 122/78  09/26/22 120/72  06/26/22 120/80    Wt Readings from Last 3 Encounters:  12/27/22  155 lb (70.3 kg)  09/26/22 151 lb 12.8 oz (68.9 kg)  06/26/22 155 lb (70.3 kg)    Physical Exam Constitutional:      General: He is not in acute distress.    Appearance: He is well-developed. He is not diaphoretic.     Comments: NAD  HENT:     Head: Normocephalic and atraumatic.     Right Ear: External ear normal.     Left Ear: External ear normal.     Nose: Nose normal.     Mouth/Throat:     Pharynx: No oropharyngeal exudate.  Eyes:     General: No scleral icterus.       Right eye: No discharge.        Left eye: No discharge.     Conjunctiva/sclera: Conjunctivae normal.     Pupils: Pupils are equal, round, and reactive to light.  Neck:     Thyroid: No thyromegaly.     Vascular: No JVD.     Trachea: No tracheal deviation.  Cardiovascular:     Rate and Rhythm: Normal rate and regular rhythm.     Heart sounds: Normal heart sounds. No murmur heard.    No friction rub. No gallop.  Pulmonary:     Effort: Pulmonary effort is normal. No respiratory distress.     Breath sounds: Normal breath sounds. No stridor. No wheezing or rales.  Chest:     Chest wall: No tenderness.  Abdominal:     General: Bowel sounds  are normal. There is no distension.     Palpations: Abdomen is soft. There is no mass.     Tenderness: There is no abdominal tenderness. There is no guarding or rebound.  Genitourinary:    Penis: Normal. No tenderness.      Prostate: Normal.     Rectum: Normal. Guaiac result negative.  Musculoskeletal:        General: No tenderness. Normal range of motion.     Cervical back: Normal range of motion and neck supple.  Lymphadenopathy:     Cervical: No cervical adenopathy.  Skin:    General: Skin is warm and dry.     Coloration: Skin is not pale.     Findings: No erythema or rash.  Neurological:     Mental Status: He is alert and oriented to person, place, and time.     Cranial Nerves: No cranial nerve deficit.     Motor: No abnormal muscle tone.     Coordination:  Coordination normal.     Gait: Gait normal.     Deep Tendon Reflexes: Reflexes are normal and symmetric. Reflexes normal.  Psychiatric:        Behavior: Behavior normal.        Thought Content: Thought content normal.        Judgment: Judgment normal.     Lab Results  Component Value Date   WBC 6.3 12/12/2021   HGB 15.9 12/12/2021   HCT 45.5 12/12/2021   PLT 253.0 12/12/2021   GLUCOSE 86 06/26/2022   CHOL 191 06/26/2022   TRIG 112.0 06/26/2022   HDL 33.80 (L) 06/26/2022   LDLDIRECT 134.0 10/14/2020   LDLCALC 135 (H) 06/26/2022   ALT 22 06/26/2022   AST 17 06/26/2022   NA 138 06/26/2022   K 3.9 06/26/2022   CL 101 06/26/2022   CREATININE 0.97 06/26/2022   BUN 9 06/26/2022   CO2 29 06/26/2022   TSH 1.89 12/12/2021   PSA 1.84 12/12/2021   INR 1.1 (H) 03/06/2016    US Abdomen Limited RUQ (LIVER/GB)  Result Date: 01/03/2022 CLINICAL DATA:  Abnormal liver function tests EXAM: ULTRASOUND ABDOMEN LIMITED RIGHT UPPER QUADRANT COMPARISON:  None. FINDINGS: Gallbladder: No gallstones or wall thickening visualized. No sonographic Murphy sign noted by sonographer. Common bile duct: Diameter: 3.6 mm Liver: There is increased echogenicity in the liver. No focal abnormality is seen in the visualized portions of liver. Portal vein is patent on color Doppler imaging with normal direction of blood flow towards the liver. Other: None. IMPRESSION: Fatty liver. No other sonographic abnormality is seen in the right upper quadrant. Electronically Signed   By: Elmer Picker M.D.   On: 01/03/2022 14:46    Assessment & Plan:   Problem List Items Addressed This Visit       Digestive   Fatty liver - Primary    On phentermine to assist with weight loss        Other   Obesity (BMI 30.0-34.9)    Cont on  phentermine to assist with weight loss/fatty liver/dyslipidemia Potential benefits of a short term phentermine use as well as potential risks  and complications were explained to the  patient and were aknowledged.      Leg pain    Meralgia paresthetica (MP) discussed.  Recommendations were made      Dyslipidemia    Cont on  phentermine to assist with weight loss/fatty liver/dyslipidemia Potential benefits of a short term phentermine use as well as potential risks  and complications were explained to the patient and were aknowledged. Continue on Lovaza      Relevant Medications   omega-3 acid ethyl esters (LOVAZA) 1 g capsule      Meds ordered this encounter  Medications   omega-3 acid ethyl esters (LOVAZA) 1 g capsule    Sig: Take 2 capsules (2 g total) by mouth 2 (two) times daily. Annual appt due in Nov must see provider for future refills    Dispense:  360 capsule    Refill:  3   phentermine (ADIPEX-P) 37.5 MG tablet    Sig: Take 1 tablet (37.5 mg total) by mouth daily before breakfast.    Dispense:  30 tablet    Refill:  2      Follow-up: Return in about 3 months (around 03/28/2023) for a follow-up visit.  Walker Kehr, MD

## 2022-12-27 NOTE — Assessment & Plan Note (Signed)
On phentermine to assist with weight loss

## 2022-12-27 NOTE — Patient Instructions (Signed)
Meralgia paresthetica (MP) is pain or an irritating sensation felt over the anterior or anterolateral aspect of the thigh due to injury, compression, or disease of the lateral femoral cutaneous nerve (LFCN)

## 2022-12-30 NOTE — Assessment & Plan Note (Signed)
Cont on  phentermine to assist with weight loss/fatty liver/dyslipidemia Potential benefits of a short term phentermine use as well as potential risks  and complications were explained to the patient and were aknowledged.

## 2022-12-30 NOTE — Assessment & Plan Note (Signed)
Cont on  phentermine to assist with weight loss/fatty liver/dyslipidemia Potential benefits of a short term phentermine use as well as potential risks  and complications were explained to the patient and were aknowledged. Continue on Lovaza

## 2023-01-09 DIAGNOSIS — H33321 Round hole, right eye: Secondary | ICD-10-CM | POA: Insufficient documentation

## 2023-03-28 ENCOUNTER — Ambulatory Visit: Payer: No Typology Code available for payment source | Admitting: Internal Medicine

## 2023-03-28 ENCOUNTER — Encounter: Payer: Self-pay | Admitting: Internal Medicine

## 2023-03-28 VITALS — BP 110/70 | HR 87 | Temp 98.2°F | Ht 62.0 in | Wt 158.0 lb

## 2023-03-28 DIAGNOSIS — E559 Vitamin D deficiency, unspecified: Secondary | ICD-10-CM | POA: Diagnosis not present

## 2023-03-28 DIAGNOSIS — E785 Hyperlipidemia, unspecified: Secondary | ICD-10-CM

## 2023-03-28 DIAGNOSIS — K760 Fatty (change of) liver, not elsewhere classified: Secondary | ICD-10-CM

## 2023-03-28 DIAGNOSIS — Z0000 Encounter for general adult medical examination without abnormal findings: Secondary | ICD-10-CM

## 2023-03-28 DIAGNOSIS — Z125 Encounter for screening for malignant neoplasm of prostate: Secondary | ICD-10-CM

## 2023-03-28 MED ORDER — CLOTRIMAZOLE-BETAMETHASONE 1-0.05 % EX CREA: 1.0000 | TOPICAL_CREAM | Freq: Every day | CUTANEOUS | 1 refills | Status: DC

## 2023-03-28 NOTE — Assessment & Plan Note (Addendum)
Coronary ca CT Fish oil Diet, exercise

## 2023-03-28 NOTE — Progress Notes (Signed)
Subjective:  Patient ID: [name], male    DOB: 02-19-1971  Age: 52 y.o. MRN: [id number]  CC: Follow-up (3 mnth f/u)   HPI [name] presents for dyslipidemia, rash, fatty liver  Outpatient Medications Prior to Visit  Medication Sig Dispense Refill   Cholecalciferol 25 MCG (1000 UT) tablet Take by mouth.     omega-3 acid ethyl esters (LOVAZA) 1 g capsule Take 2 capsules (2 g total) by mouth 2 (two) times daily. Annual appt due in Nov must see provider for future refills 360 capsule 3   phentermine (ADIPEX-P) 37.5 MG tablet Take 1 tablet (37.5 mg total) by mouth daily before breakfast. 30 tablet 2   No facility-administered medications prior to visit.    ROS: Review of Systems  Constitutional:  Negative for appetite change, fatigue and unexpected weight change.  HENT:  Negative for congestion, nosebleeds, sneezing, sore throat and trouble swallowing.   Eyes:  Negative for itching and visual disturbance.  Respiratory:  Negative for cough.   Cardiovascular:  Negative for chest pain, palpitations and leg swelling.  Gastrointestinal:  Negative for abdominal distention, blood in stool, diarrhea and nausea.  Genitourinary:  Negative for frequency and hematuria.  Musculoskeletal:  Negative for back pain, gait problem, joint swelling and neck pain.  Skin:  Negative for rash.  Neurological:  Negative for dizziness, tremors, speech difficulty and weakness.  Psychiatric/Behavioral:  Negative for agitation, dysphoric mood and sleep disturbance. The patient is not nervous/anxious.     Objective:  BP 110/70 (BP Location: Left Arm, Patient Position: Sitting, Cuff Size: Normal)   Pulse 87   Temp 98.2 F (36.8 C) (Oral)   Ht 5\' 2"  (1.575 m)   Wt 158 lb (71.7 kg)   SpO2 97%   BMI 28.90 kg/m   BP Readings from Last 3 Encounters:  03/28/23 110/70  12/27/22 122/78  09/26/22 120/72    Wt Readings from Last 3 Encounters:  03/28/23 158 lb (71.7 kg)  12/27/22 155 lb (70.3 kg)   09/26/22 151 lb 12.8 oz (68.9 kg)    Physical Exam Constitutional:      General: He is not in acute distress.    Appearance: Normal appearance. He is well-developed.     Comments: NAD  Eyes:     Conjunctiva/sclera: Conjunctivae normal.     Pupils: Pupils are equal, round, and reactive to light.  Neck:     Thyroid: No thyromegaly.     Vascular: No JVD.  Cardiovascular:     Rate and Rhythm: Normal rate and regular rhythm.     Heart sounds: Normal heart sounds. No murmur heard.    No friction rub. No gallop.  Pulmonary:     Effort: Pulmonary effort is normal. No respiratory distress.     Breath sounds: Normal breath sounds. No wheezing or rales.  Chest:     Chest wall: No tenderness.  Abdominal:     General: Bowel sounds are normal. There is no distension.     Palpations: Abdomen is soft. There is no mass.     Tenderness: There is no abdominal tenderness. There is no guarding or rebound.  Musculoskeletal:        General: No tenderness. Normal range of motion.     Cervical back: Normal range of motion.  Lymphadenopathy:     Cervical: No cervical adenopathy.  Skin:    General: Skin is warm and dry.     Findings: No rash.  Neurological:     Mental Status:  He is alert and oriented to person, place, and time.     Cranial Nerves: No cranial nerve deficit.     Motor: No abnormal muscle tone.     Coordination: Coordination normal.     Gait: Gait normal.     Deep Tendon Reflexes: Reflexes are normal and symmetric.  Psychiatric:        Behavior: Behavior normal.        Thought Content: Thought content normal.        Judgment: Judgment normal.     Lab Results  Component Value Date   WBC 6.3 12/12/2021   HGB 15.9 12/12/2021   HCT 45.5 12/12/2021   PLT 253.0 12/12/2021   GLUCOSE 86 06/26/2022   CHOL 191 06/26/2022   TRIG 112.0 06/26/2022   HDL 33.80 (L) 06/26/2022   LDLDIRECT 134.0 10/14/2020   LDLCALC 135 (H) 06/26/2022   ALT 22 06/26/2022   AST 17 06/26/2022   NA  138 06/26/2022   K 3.9 06/26/2022   CL 101 06/26/2022   CREATININE 0.97 06/26/2022   BUN 9 06/26/2022   CO2 29 06/26/2022   TSH 1.89 12/12/2021   PSA 1.84 12/12/2021   INR 1.1 (H) 03/06/2016    US Abdomen Limited RUQ (LIVER/GB)  Result Date: 01/03/2022 CLINICAL DATA:  Abnormal liver function tests EXAM: ULTRASOUND ABDOMEN LIMITED RIGHT UPPER QUADRANT COMPARISON:  None. FINDINGS: Gallbladder: No gallstones or wall thickening visualized. No sonographic Murphy sign noted by sonographer. Common bile duct: Diameter: 3.6 mm Liver: There is increased echogenicity in the liver. No focal abnormality is seen in the visualized portions of liver. Portal vein is patent on color Doppler imaging with normal direction of blood flow towards the liver. Other: None. IMPRESSION: Fatty liver. No other sonographic abnormality is seen in the right upper quadrant. Electronically Signed   By: Ernie Avena M.D.   On: 01/03/2022 14:46    Assessment & Plan:   Problem List Items Addressed This Visit       Digestive   Fatty liver    Check LFTs        Other   Dyslipidemia - Primary    Coronary ca CT Fish oil Diet, exercise      Relevant Orders   CT CARDIAC SCORING (SELF PAY ONLY)   Well adult exam   Relevant Orders   TSH   Urinalysis   CBC with Differential/Platelet   Lipid panel   PSA   Comprehensive metabolic panel   VITAMIN D 25 Hydroxy (Vit-D Deficiency, Fractures)   Other Visit Diagnoses     Vitamin D deficiency       Relevant Orders   VITAMIN D 25 Hydroxy (Vit-D Deficiency, Fractures)         Meds ordered this encounter  Medications   clotrimazole-betamethasone (LOTRISONE) cream    Sig: Apply 1 Application topically daily.    Dispense:  45 g    Refill:  1      Follow-up: Return in about 6 months (around 09/27/2023) for Wellness Exam.  Sonda Primes, MD

## 2023-03-28 NOTE — Assessment & Plan Note (Signed)
Check LFTs 

## 2023-03-29 LAB — URINALYSIS, ROUTINE W REFLEX MICROSCOPIC
Bilirubin Urine: NEGATIVE
Hgb urine dipstick: NEGATIVE
Ketones, ur: NEGATIVE
Nitrite: NEGATIVE
Specific Gravity, Urine: 1.015 (ref 1.000–1.030)
Total Protein, Urine: NEGATIVE
Urine Glucose: NEGATIVE
Urobilinogen, UA: 0.2 (ref 0.0–1.0)
pH: 7 (ref 5.0–8.0)

## 2023-03-29 LAB — CBC WITH DIFFERENTIAL/PLATELET
Basophils Absolute: 0 10*3/uL (ref 0.0–0.1)
Basophils Relative: 0.7 % (ref 0.0–3.0)
Eosinophils Absolute: 0.3 10*3/uL (ref 0.0–0.7)
Eosinophils Relative: 4.5 % (ref 0.0–5.0)
HCT: 47.1 % (ref 39.0–52.0)
Hemoglobin: 16.8 g/dL (ref 13.0–17.0)
Lymphocytes Relative: 44.4 % (ref 12.0–46.0)
Lymphs Abs: 2.8 10*3/uL (ref 0.7–4.0)
MCHC: 35.6 g/dL (ref 30.0–36.0)
MCV: 82.6 fl (ref 78.0–100.0)
Monocytes Absolute: 0.5 10*3/uL (ref 0.1–1.0)
Monocytes Relative: 7.8 % (ref 3.0–12.0)
Neutro Abs: 2.7 10*3/uL (ref 1.4–7.7)
Neutrophils Relative %: 42.6 % — ABNORMAL LOW (ref 43.0–77.0)
Platelets: 286 10*3/uL (ref 150.0–400.0)
RBC: 5.71 Mil/uL (ref 4.22–5.81)
RDW: 13.4 % (ref 11.5–15.5)
WBC: 6.4 10*3/uL (ref 4.0–10.5)

## 2023-03-29 LAB — COMPREHENSIVE METABOLIC PANEL
ALT: 42 U/L (ref 0–53)
AST: 26 U/L (ref 0–37)
Albumin: 4.6 g/dL (ref 3.5–5.2)
Alkaline Phosphatase: 76 U/L (ref 39–117)
BUN: 10 mg/dL (ref 6–23)
CO2: 30 mEq/L (ref 19–32)
Calcium: 9.7 mg/dL (ref 8.4–10.5)
Chloride: 99 mEq/L (ref 96–112)
Creatinine, Ser: 0.98 mg/dL (ref 0.40–1.50)
GFR: 90.01 mL/min (ref >60.00)
Glucose, Bld: 85 mg/dL (ref 70–99)
Potassium: 4.6 mEq/L (ref 3.5–5.1)
Sodium: 138 mEq/L (ref 135–145)
Total Bilirubin: 0.7 mg/dL (ref 0.2–1.2)
Total Protein: 7.1 g/dL (ref 6.0–8.3)

## 2023-03-29 LAB — PSA: PSA: 3.55 ng/mL (ref 0.10–4.00)

## 2023-03-29 LAB — LIPID PANEL
Cholesterol: 210 mg/dL — ABNORMAL HIGH (ref 0–200)
HDL: 45.5 mg/dL (ref >39.00)
LDL Cholesterol: 140 mg/dL — ABNORMAL HIGH (ref 0–99)
NonHDL: 164.43
Total CHOL/HDL Ratio: 5
Triglycerides: 123 mg/dL (ref 0.0–149.0)
VLDL: 24.6 mg/dL (ref 0.0–40.0)

## 2023-03-29 LAB — VITAMIN D 25 HYDROXY (VIT D DEFICIENCY, FRACTURES): VITD: 28.78 ng/mL — ABNORMAL LOW (ref 30.00–100.00)

## 2023-03-29 LAB — TSH: TSH: 2.28 u[IU]/mL (ref 0.35–5.50)

## 2023-04-01 ENCOUNTER — Other Ambulatory Visit: Payer: Self-pay | Admitting: Internal Medicine

## 2023-04-01 MED ORDER — VITAMIN D (ERGOCALCIFEROL) 1.25 MG (50000 UNIT) PO CAPS: 50000.0000 [IU] | ORAL_CAPSULE | ORAL | 0 refills | Status: AC

## 2023-04-01 MED ORDER — VITAMIN D3 50 MCG (2000 UT) PO CAPS: 2000.0000 [IU] | ORAL_CAPSULE | Freq: Every day | ORAL | 3 refills | Status: AC

## 2023-05-09 ENCOUNTER — Other Ambulatory Visit: Payer: Self-pay | Admitting: Internal Medicine

## 2023-05-09 ENCOUNTER — Ambulatory Visit
Admission: RE | Admit: 2023-05-09 | Discharge: 2023-05-09 | Disposition: A | Discharge status: Home / Self Care | Payer: No Typology Code available for payment source | Source: Ambulatory Visit | Attending: Internal Medicine | Admitting: Internal Medicine

## 2023-05-09 DIAGNOSIS — E785 Hyperlipidemia, unspecified: Secondary | ICD-10-CM

## 2023-05-20 ENCOUNTER — Other Ambulatory Visit: Payer: Self-pay | Admitting: Internal Medicine

## 2023-08-05 IMAGING — US US ABDOMEN LIMITED
1 series · 14 of 25 positions shown · non-contrast
Comparison: None.

CLINICAL DATA: Abnormal liver function tests

EXAM:
ULTRASOUND ABDOMEN LIMITED RIGHT UPPER QUADRANT

[Series 1: us abdomen limited · 0.23mm/px · 14 of 31 slices shown]
[im 1/31]
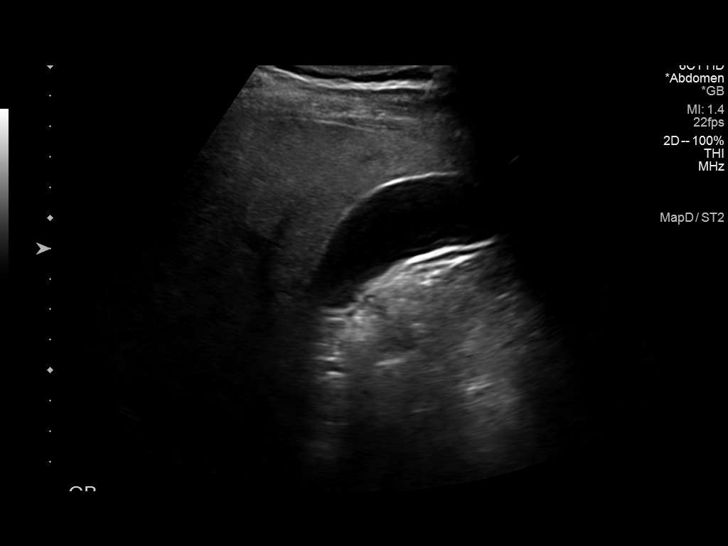
[im 3/31]
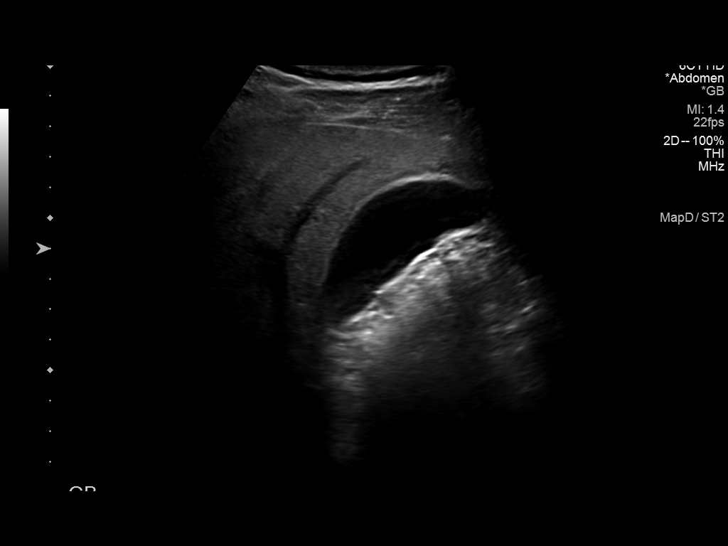
[im 6/31]
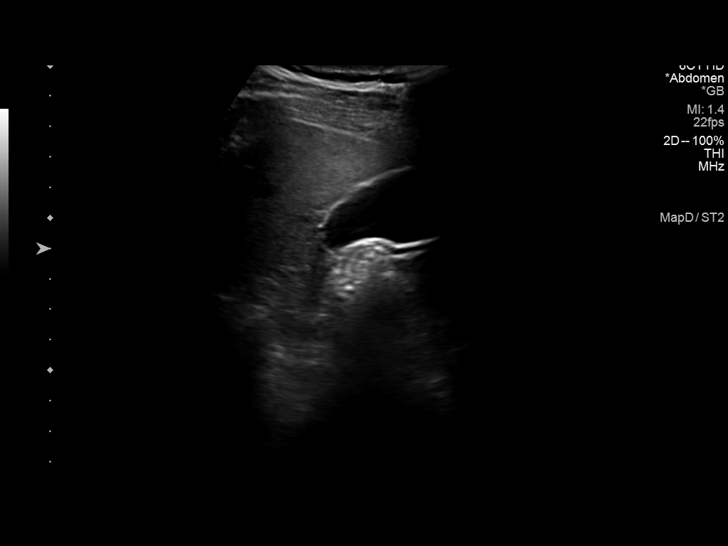
[im 8/31]
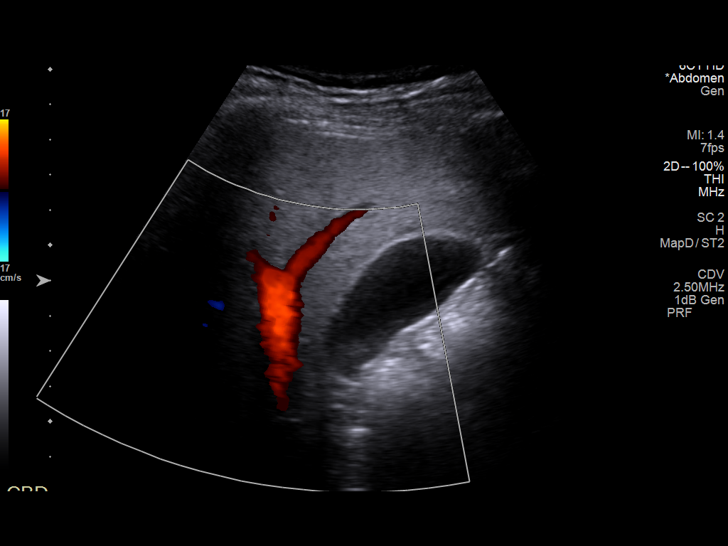
[im 11/31]
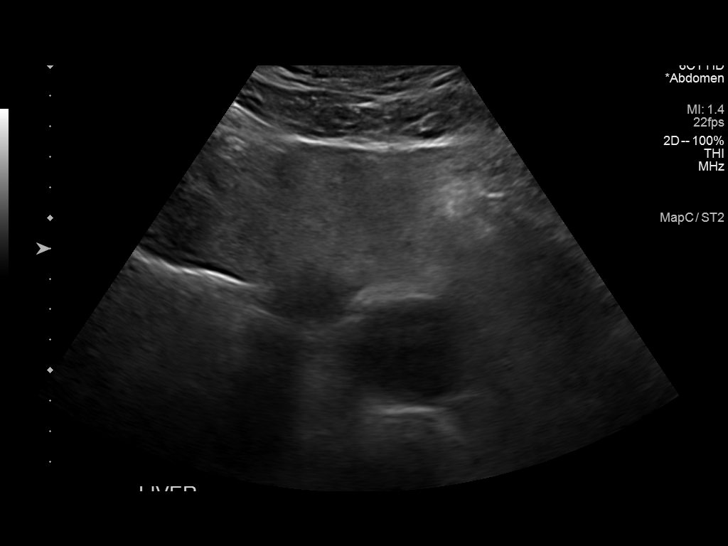
[im 12/31]
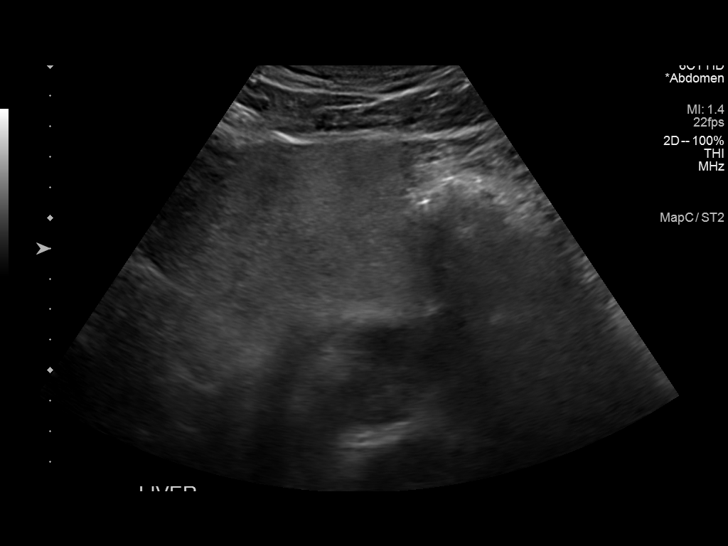
[im 14/31]
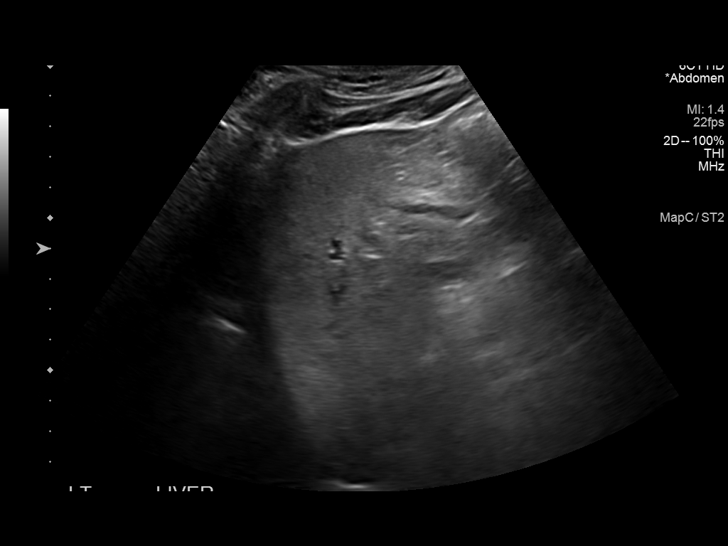
[im 17/31]
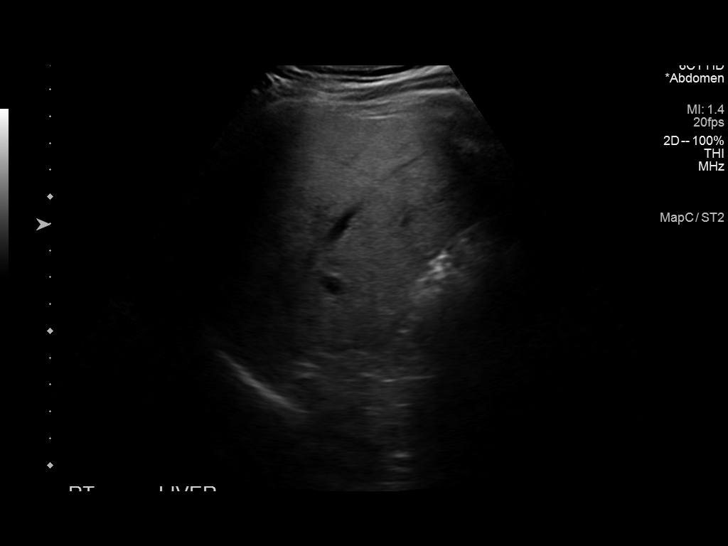
[im 19/31]
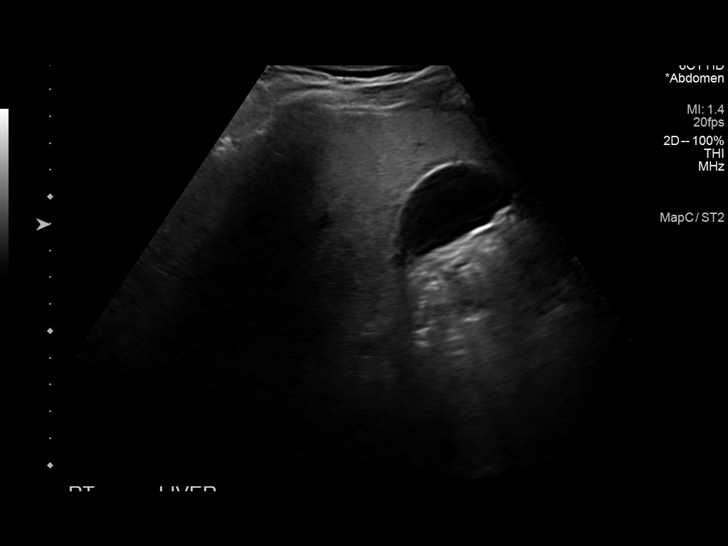
[im 21/31]
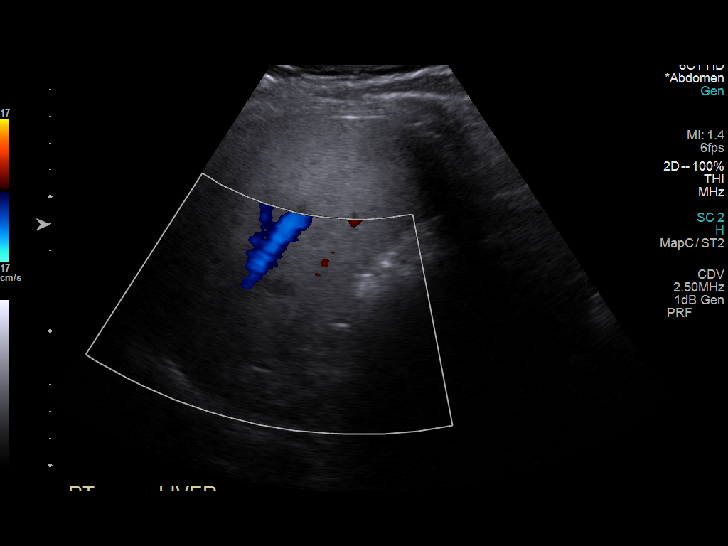
[im 23/31]
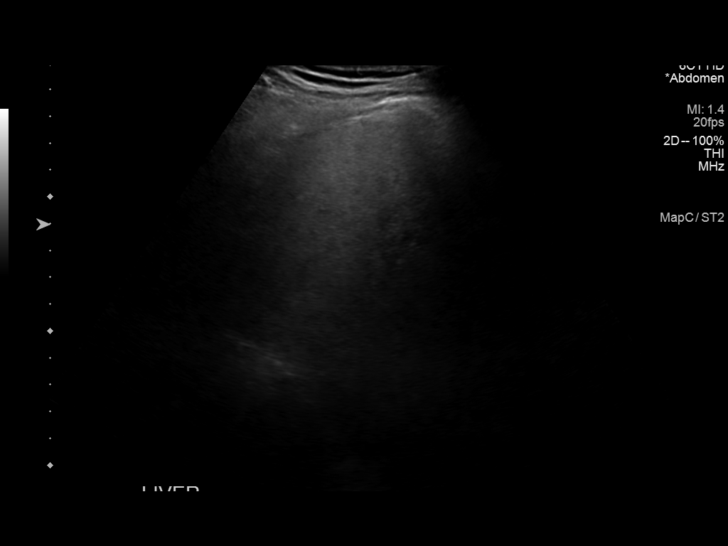
[im 26/31]
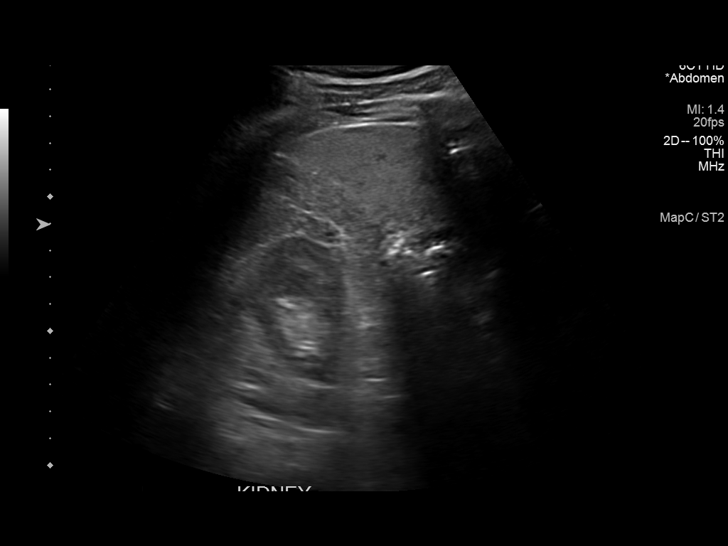
[im 28/31]
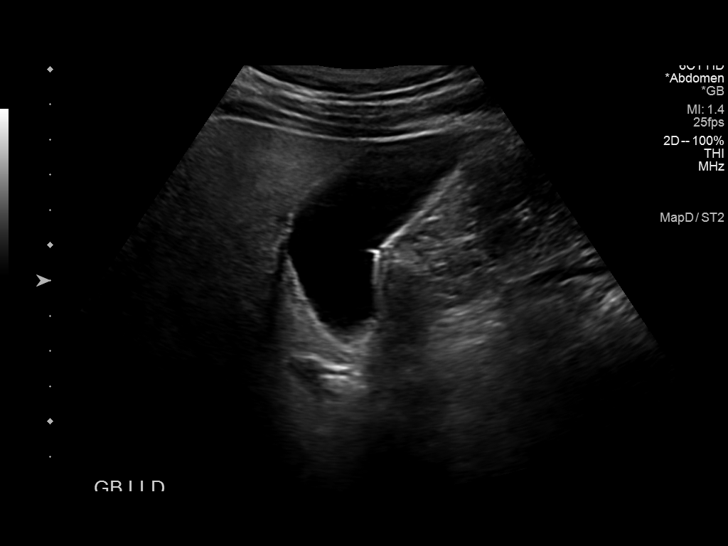
[im 31/31]
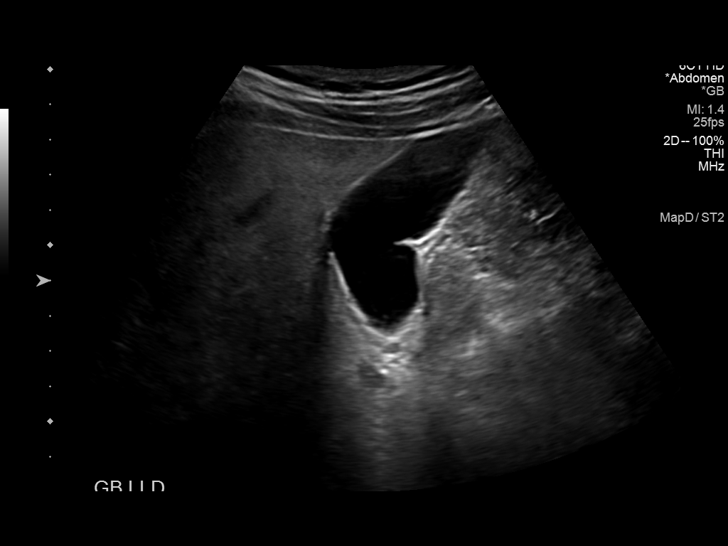

[14 of 25 positions shown; findings below may reference images not displayed]

FINDINGS: Gallbladder:

No gallstones or wall thickening visualized. No sonographic Murphy
sign noted by sonographer.

Common bile duct:

Diameter: 3.6 mm

Liver:

There is increased echogenicity in the liver. No focal abnormality
is seen in the visualized portions of liver. Portal vein is patent
on color Doppler imaging with normal direction of blood flow towards
the liver.

Other: None.
IMPRESSION: Fatty liver. No other sonographic abnormality is seen in the right
upper quadrant.

## 2023-09-30 ENCOUNTER — Ambulatory Visit: Payer: No Typology Code available for payment source | Admitting: Internal Medicine

## 2023-09-30 ENCOUNTER — Encounter: Payer: Self-pay | Admitting: Internal Medicine

## 2023-09-30 VITALS — BP 116/80 | HR 84 | Temp 97.9°F | Ht 62.0 in | Wt 154.0 lb

## 2023-09-30 DIAGNOSIS — E66811 Obesity, class 1: Secondary | ICD-10-CM | POA: Diagnosis not present

## 2023-09-30 DIAGNOSIS — Z23 Encounter for immunization: Secondary | ICD-10-CM

## 2023-09-30 DIAGNOSIS — E559 Vitamin D deficiency, unspecified: Secondary | ICD-10-CM | POA: Diagnosis not present

## 2023-09-30 DIAGNOSIS — E785 Hyperlipidemia, unspecified: Secondary | ICD-10-CM

## 2023-09-30 MED ORDER — PHENTERMINE HCL 37.5 MG PO TABS: 37.5000 mg | ORAL_TABLET | Freq: Every day | ORAL | 3 refills | Status: DC

## 2023-09-30 NOTE — Addendum Note (Signed)
Addended by: Delsa Grana R on: 09/30/2023 08:52 AM   Modules accepted: Orders

## 2023-09-30 NOTE — Progress Notes (Signed)
Subjective:  Patient ID: [name], male    DOB: 04-Nov-1971  Age: 52 y.o. MRN: [id number]  CC: Follow-up (6 mnth f/u)   HPI Can Lucci presents for a 6 months f/u - dyslipidemia, vit D def  Outpatient Medications Prior to Visit  Medication Sig Dispense Refill   Cholecalciferol (VITAMIN D3) 50 MCG (2000 UT) CAPS Take 1 capsule (2,000 Units total) by mouth daily. 100 capsule 3   clotrimazole-betamethasone (LOTRISONE) cream Apply 1 Application topically daily. 45 g 1   omega-3 acid ethyl esters (LOVAZA) 1 g capsule Take 2 capsules (2 g total) by mouth 2 (two) times daily. Annual appt due in Nov must see provider for future refills 360 capsule 3   Vitamin D, Ergocalciferol, (DRISDOL) 1.25 MG (50000 UNIT) CAPS capsule Take 1 capsule (50,000 Units total) by mouth every 7 (seven) days. 6 capsule 0   phentermine (ADIPEX-P) 37.5 MG tablet TAKE 1 TABLET BY MOUTH DAILY BEFORE BREAKFAST 30 tablet 3   No facility-administered medications prior to visit.    ROS: Review of Systems  Constitutional:  Negative for appetite change, fatigue and unexpected weight change.  HENT:  Negative for congestion, nosebleeds, sneezing, sore throat and trouble swallowing.   Eyes:  Negative for itching and visual disturbance.  Respiratory:  Negative for cough.   Cardiovascular:  Negative for chest pain, palpitations and leg swelling.  Gastrointestinal:  Negative for abdominal distention, blood in stool, diarrhea and nausea.  Genitourinary:  Negative for frequency and hematuria.  Musculoskeletal:  Negative for back pain, gait problem, joint swelling and neck pain.  Skin:  Negative for rash.  Neurological:  Negative for dizziness, tremors, speech difficulty and weakness.  Psychiatric/Behavioral:  Negative for agitation, dysphoric mood and sleep disturbance. The patient is not nervous/anxious.     Objective:  BP 116/80 (BP Location: Left Arm, Patient Position: Sitting, Cuff Size: Normal)   Pulse 84   Temp  97.9 F (36.6 C) (Oral)   Ht 5\' 2"  (1.575 m)   Wt 154 lb (69.9 kg)   SpO2 96%   BMI 28.17 kg/m   BP Readings from Last 3 Encounters:  09/30/23 116/80  03/28/23 110/70  12/27/22 122/78    Wt Readings from Last 3 Encounters:  09/30/23 154 lb (69.9 kg)  03/28/23 158 lb (71.7 kg)  12/27/22 155 lb (70.3 kg)    Physical Exam Constitutional:      General: He is not in acute distress.    Appearance: He is well-developed.     Comments: NAD  Eyes:     Conjunctiva/sclera: Conjunctivae normal.     Pupils: Pupils are equal, round, and reactive to light.  Neck:     Thyroid: No thyromegaly.     Vascular: No JVD.  Cardiovascular:     Rate and Rhythm: Normal rate and regular rhythm.     Heart sounds: Normal heart sounds. No murmur heard.    No friction rub. No gallop.  Pulmonary:     Effort: Pulmonary effort is normal. No respiratory distress.     Breath sounds: Normal breath sounds. No wheezing or rales.  Chest:     Chest wall: No tenderness.  Abdominal:     General: Bowel sounds are normal. There is no distension.     Palpations: Abdomen is soft. There is no mass.     Tenderness: There is no abdominal tenderness. There is no guarding or rebound.  Musculoskeletal:        General: No tenderness. Normal range of  motion.     Cervical back: Normal range of motion.  Lymphadenopathy:     Cervical: No cervical adenopathy.  Skin:    General: Skin is warm and dry.     Findings: No rash.  Neurological:     Mental Status: He is alert and oriented to person, place, and time.     Cranial Nerves: No cranial nerve deficit.     Motor: No abnormal muscle tone.     Coordination: Coordination normal.     Gait: Gait normal.     Deep Tendon Reflexes: Reflexes are normal and symmetric.  Psychiatric:        Behavior: Behavior normal.        Thought Content: Thought content normal.        Judgment: Judgment normal.     Lab Results  Component Value Date   WBC 6.4 03/29/2023   HGB 16.8  03/29/2023   HCT 47.1 03/29/2023   PLT 286.0 03/29/2023   GLUCOSE 85 03/29/2023   CHOL 210 (H) 03/29/2023   TRIG 123.0 03/29/2023   HDL 45.50 03/29/2023   LDLDIRECT 134.0 10/14/2020   LDLCALC 140 (H) 03/29/2023   ALT 42 03/29/2023   AST 26 03/29/2023   NA 138 03/29/2023   K 4.6 03/29/2023   CL 99 03/29/2023   CREATININE 0.98 03/29/2023   BUN 10 03/29/2023   CO2 30 03/29/2023   TSH 2.28 03/29/2023   PSA 3.55 03/29/2023   INR 1.1 (H) 03/06/2016    CT CARDIAC SCORING ([facility] LOCATIONS ONLY)  Result Date: 05/09/2023 CLINICAL DATA:  Dyslipidemia * Tracking Code: FCC * EXAM: CT CARDIAC CORONARY ARTERY CALCIUM SCORE TECHNIQUE: Non-contrast imaging through the heart was performed using prospective ECG gating. Image post processing was performed on an independent workstation, allowing for quantitative analysis of the heart and coronary arteries. Note that this exam targets the heart and the chest was not imaged in its entirety. COMPARISON:  None available. FINDINGS: CORONARY CALCIUM SCORES: Left Main: 0 LAD: 0 LCx: 0 RCA: 0 Total Agatston Score: 0 MESA database percentile: 0 AORTA MEASUREMENTS: Ascending Aorta: 2.9 cm Descending Aorta:2.2 cm OTHER FINDINGS: Heart is normal size. Aorta normal caliber. No adenopathy. Few punctate calcifications in the aortic root. No confluent airspace opacities or effusions. No acute findings in the upper abdomen. Chest wall soft tissues are unremarkable. No acute bony abnormality. IMPRESSION: No visible coronary artery calcifications. Total coronary calcium score of 0. Few punctate calcifications in the aortic root. No acute extra cardiac abnormality. Electronically Signed   By: Charlett Nose M.D.   On: 05/09/2023 16:57    Assessment & Plan:   Problem List Items Addressed This Visit     Dyslipidemia - Primary    Coronary ca CT Fish oil  Diet, exercise Coronary calcium CT ordered Total coronary calcium CT score of 0 - 2024.      Relevant Orders    Comprehensive metabolic panel   Lipid panel   Obesity (BMI 30.0-34.9)    Doing well on diet         Meds ordered this encounter  Medications   phentermine (ADIPEX-P) 37.5 MG tablet    Sig: Take 1 tablet (37.5 mg total) by mouth daily before breakfast.    Dispense:  30 tablet    Refill:  3      Follow-up: Return in about 3 months (around 12/31/2023) for a follow-up visit.  Sonda Primes, MD

## 2023-09-30 NOTE — Assessment & Plan Note (Signed)
Doing well on diet ?

## 2023-09-30 NOTE — Assessment & Plan Note (Signed)
Coronary ca CT Fish oil  Diet, exercise Coronary calcium CT ordered Total coronary calcium CT score of 0 - 2024.

## 2023-10-01 LAB — COMPREHENSIVE METABOLIC PANEL
ALT: 26 U/L (ref 0–53)
AST: 20 U/L (ref 0–37)
Albumin: 4.6 g/dL (ref 3.5–5.2)
Alkaline Phosphatase: 60 U/L (ref 39–117)
BUN: 9 mg/dL (ref 6–23)
CO2: 31 meq/L (ref 19–32)
Calcium: 9.9 mg/dL (ref 8.4–10.5)
Chloride: 100 meq/L (ref 96–112)
Creatinine, Ser: 0.99 mg/dL (ref 0.40–1.50)
GFR: 88.6 mL/min (ref >60.00)
Glucose, Bld: 82 mg/dL (ref 70–99)
Potassium: 3.9 meq/L (ref 3.5–5.1)
Sodium: 139 meq/L (ref 135–145)
Total Bilirubin: 1.1 mg/dL (ref 0.2–1.2)
Total Protein: 7.3 g/dL (ref 6.0–8.3)

## 2023-10-01 LAB — LIPID PANEL
Cholesterol: 205 mg/dL — ABNORMAL HIGH (ref 0–200)
HDL: 40.8 mg/dL (ref >39.00)
LDL Cholesterol: 143 mg/dL — ABNORMAL HIGH (ref 0–99)
NonHDL: 163.72
Total CHOL/HDL Ratio: 5
Triglycerides: 102 mg/dL (ref 0.0–149.0)
VLDL: 20.4 mg/dL (ref 0.0–40.0)

## 2023-10-01 LAB — VITAMIN D 25 HYDROXY (VIT D DEFICIENCY, FRACTURES): VITD: 31.42 ng/mL (ref 30.00–100.00)

## 2024-02-13 ENCOUNTER — Other Ambulatory Visit: Payer: Self-pay | Admitting: Internal Medicine

## 2024-03-04 ENCOUNTER — Ambulatory Visit: Payer: No Typology Code available for payment source | Admitting: Internal Medicine

## 2024-03-04 ENCOUNTER — Encounter: Payer: Self-pay | Admitting: Internal Medicine

## 2024-03-04 VITALS — BP 110/78 | HR 79 | Temp 98.2°F | Ht 62.0 in | Wt 159.0 lb

## 2024-03-04 DIAGNOSIS — Z0000 Encounter for general adult medical examination without abnormal findings: Secondary | ICD-10-CM

## 2024-03-04 LAB — URINALYSIS, ROUTINE W REFLEX MICROSCOPIC
Bilirubin Urine: NEGATIVE
Hgb urine dipstick: NEGATIVE
Nitrite: NEGATIVE
Specific Gravity, Urine: 1.015 (ref 1.000–1.030)
Total Protein, Urine: NEGATIVE
Urine Glucose: NEGATIVE
Urobilinogen, UA: 0.2 (ref 0.0–1.0)
pH: 7.5 (ref 5.0–8.0)

## 2024-03-04 LAB — CBC WITH DIFFERENTIAL/PLATELET
Basophils Absolute: 0 10*3/uL (ref 0.0–0.1)
Basophils Relative: 0.4 % (ref 0.0–3.0)
Eosinophils Absolute: 0.1 10*3/uL (ref 0.0–0.7)
Eosinophils Relative: 1.2 % (ref 0.0–5.0)
HCT: 46.9 % (ref 39.0–52.0)
Hemoglobin: 16.8 g/dL (ref 13.0–17.0)
Lymphocytes Relative: 22 % (ref 12.0–46.0)
Lymphs Abs: 1.9 10*3/uL (ref 0.7–4.0)
MCHC: 35.9 g/dL (ref 30.0–36.0)
MCV: 82 fl (ref 78.0–100.0)
Monocytes Absolute: 0.6 10*3/uL (ref 0.1–1.0)
Monocytes Relative: 6.6 % (ref 3.0–12.0)
Neutro Abs: 6.2 10*3/uL (ref 1.4–7.7)
Neutrophils Relative %: 69.8 % (ref 43.0–77.0)
Platelets: 299 10*3/uL (ref 150.0–400.0)
RBC: 5.72 Mil/uL (ref 4.22–5.81)
RDW: 13.1 % (ref 11.5–15.5)
WBC: 8.9 10*3/uL (ref 4.0–10.5)

## 2024-03-04 LAB — LIPID PANEL
Cholesterol: 203 mg/dL — ABNORMAL HIGH (ref 0–200)
HDL: 40.4 mg/dL (ref >39.00)
LDL Cholesterol: 142 mg/dL — ABNORMAL HIGH (ref 0–99)
NonHDL: 162.95
Total CHOL/HDL Ratio: 5
Triglycerides: 107 mg/dL (ref 0.0–149.0)
VLDL: 21.4 mg/dL (ref 0.0–40.0)

## 2024-03-04 LAB — TSH: TSH: 1.6 u[IU]/mL (ref 0.35–5.50)

## 2024-03-04 LAB — COMPREHENSIVE METABOLIC PANEL
ALT: 30 U/L (ref 0–53)
AST: 17 U/L (ref 0–37)
Albumin: 4.8 g/dL (ref 3.5–5.2)
Alkaline Phosphatase: 69 U/L (ref 39–117)
BUN: 8 mg/dL (ref 6–23)
CO2: 30 meq/L (ref 19–32)
Calcium: 10 mg/dL (ref 8.4–10.5)
Chloride: 101 meq/L (ref 96–112)
Creatinine, Ser: 0.97 mg/dL (ref 0.40–1.50)
GFR: 90.53 mL/min (ref >60.00)
Glucose, Bld: 114 mg/dL — ABNORMAL HIGH (ref 70–99)
Potassium: 4 meq/L (ref 3.5–5.1)
Sodium: 138 meq/L (ref 135–145)
Total Bilirubin: 1 mg/dL (ref 0.2–1.2)
Total Protein: 7.2 g/dL (ref 6.0–8.3)

## 2024-03-04 LAB — PSA: PSA: 3.31 ng/mL (ref 0.10–4.00)

## 2024-03-04 MED ORDER — CLOTRIMAZOLE-BETAMETHASONE 1-0.05 % EX CREA: 1.0000 | TOPICAL_CREAM | Freq: Every day | CUTANEOUS | 1 refills | Status: AC

## 2024-03-04 MED ORDER — OMEGA-3-ACID ETHYL ESTERS 1 G PO CAPS: 2.0000 | ORAL_CAPSULE | Freq: Two times a day (BID) | ORAL | 3 refills | Status: DC

## 2024-03-04 NOTE — Patient Instructions (Signed)
 USEFUL THINGS FOR musculoskeletal pains:    A "rice sock heating pad" refers to a homemade heating pad created by filling a sock with uncooked rice, which can be heated in a microwave to provide a warm compress for sore muscles, pain relief, or other applications; essentially, it's a simple way to generate heat using readily available materials.  Key points about rice sock heat: How to make it: Fill a clean sock (preferably a tube sock) about 2/3 full with uncooked rice, tie a knot at the top to secure the rice inside.  Heating it up: Place the rice sock in the microwave and heat in short intervals (usually around 30 seconds at a time) until it reaches the desired warmth.  Important considerations: Check temperature before applying: Always test the temperature of the rice sock before applying it to your skin to avoid burns.  Use a towel to protect skin: Wrap the rice sock in a thin towel to distribute the heat evenly and protect your skin.  Uses: Muscle aches and pains  Menstrual cramps  Neck pain  Arthritis discomfort    BLUE EMU CREAM: Use it 2-3 times a day on painful areas

## 2024-03-04 NOTE — Assessment & Plan Note (Addendum)
  We discussed age appropriate health related issues, including available/recomended screening tests and vaccinations. Labs were ordered to be later reviewed . All questions were answered. We discussed one or more of the following - seat belt use, use of sunscreen/sun exposure exercise, safe sex, fall risk reduction, second hand smoke exposure, firearm use and storage, seat belt use, a need for adhering to healthy diet and exercise. Labs were ordered.  All questions were answered. Zoster vaccine option discussed Colon 2017 due in 2027 - Dr Christella Hartigan

## 2024-03-04 NOTE — Progress Notes (Signed)
 Subjective:  Patient ID: [name], male    DOB: 01-27-1971  Age: 53 y.o. MRN: [id number]  CC: Annual Exam   HPI [name] presents for a well exam  Outpatient Medications Prior to Visit  Medication Sig Dispense Refill   Cholecalciferol (VITAMIN D3) 50 MCG (2000 UT) CAPS Take 1 capsule (2,000 Units total) by mouth daily. 100 capsule 3   phentermine (ADIPEX-P) 37.5 MG tablet TAKE 1 TABLET BY MOUTH DAILY BEFORE BREAKFAST 30 tablet 2   Vitamin D, Ergocalciferol, (DRISDOL) 1.25 MG (50000 UNIT) CAPS capsule Take 1 capsule (50,000 Units total) by mouth every 7 (seven) days. 6 capsule 0   clotrimazole-betamethasone (LOTRISONE) cream Apply 1 Application topically daily. 45 g 1   omega-3 acid ethyl esters (LOVAZA) 1 g capsule Take 2 capsules (2 g total) by mouth 2 (two) times daily. Annual appt due in Nov must see provider for future refills 360 capsule 3   No facility-administered medications prior to visit.    ROS: Review of Systems  Constitutional:  Negative for appetite change, fatigue and unexpected weight change.  HENT:  Negative for congestion, nosebleeds, sneezing, sore throat and trouble swallowing.   Eyes:  Negative for itching and visual disturbance.  Respiratory:  Negative for cough.   Cardiovascular:  Negative for chest pain, palpitations and leg swelling.  Gastrointestinal:  Negative for abdominal distention, blood in stool, diarrhea and nausea.  Genitourinary:  Negative for frequency and hematuria.  Musculoskeletal:  Negative for back pain, gait problem, joint swelling and neck pain.  Skin:  Negative for rash.  Neurological:  Negative for dizziness, tremors, speech difficulty and weakness.  Psychiatric/Behavioral:  Negative for agitation, dysphoric mood and sleep disturbance. The patient is not nervous/anxious.     Objective:  BP 110/78   Pulse 79   Temp 98.2 F (36.8 C) (Oral)   Ht 5\' 2"  (1.575 m)   Wt 159 lb (72.1 kg)   SpO2 99%   BMI 29.08 kg/m   BP  Readings from Last 3 Encounters:  03/04/24 110/78  09/30/23 116/80  03/28/23 110/70    Wt Readings from Last 3 Encounters:  03/04/24 159 lb (72.1 kg)  09/30/23 154 lb (69.9 kg)  03/28/23 158 lb (71.7 kg)    Physical Exam Constitutional:      General: He is not in acute distress.    Appearance: He is well-developed.     Comments: NAD  Eyes:     Conjunctiva/sclera: Conjunctivae normal.     Pupils: Pupils are equal, round, and reactive to light.  Neck:     Thyroid: No thyromegaly.     Vascular: No JVD.  Cardiovascular:     Rate and Rhythm: Normal rate and regular rhythm.     Heart sounds: Normal heart sounds. No murmur heard.    No friction rub. No gallop.  Pulmonary:     Effort: Pulmonary effort is normal. No respiratory distress.     Breath sounds: Normal breath sounds. No wheezing or rales.  Chest:     Chest wall: No tenderness.  Abdominal:     General: Bowel sounds are normal. There is no distension.     Palpations: Abdomen is soft. There is no mass.     Tenderness: There is no abdominal tenderness. There is no guarding or rebound.  Musculoskeletal:        General: No tenderness. Normal range of motion.     Cervical back: Normal range of motion.  Lymphadenopathy:     Cervical:  No cervical adenopathy.  Skin:    General: Skin is warm and dry.     Findings: No rash.  Neurological:     Mental Status: He is alert and oriented to person, place, and time.     Cranial Nerves: No cranial nerve deficit.     Motor: No abnormal muscle tone.     Coordination: Coordination normal.     Gait: Gait normal.     Deep Tendon Reflexes: Reflexes are normal and symmetric.  Psychiatric:        Behavior: Behavior normal.        Thought Content: Thought content normal.        Judgment: Judgment normal.   Rectal - deferred   Lab Results  Component Value Date   WBC 6.4 03/29/2023   HGB 16.8 03/29/2023   HCT 47.1 03/29/2023   PLT 286.0 03/29/2023   GLUCOSE 82 10/01/2023   CHOL  205 (H) 10/01/2023   TRIG 102.0 10/01/2023   HDL 40.80 10/01/2023   LDLDIRECT 134.0 10/14/2020   LDLCALC 143 (H) 10/01/2023   ALT 26 10/01/2023   AST 20 10/01/2023   NA 139 10/01/2023   K 3.9 10/01/2023   CL 100 10/01/2023   CREATININE 0.99 10/01/2023   BUN 9 10/01/2023   CO2 31 10/01/2023   TSH 2.28 03/29/2023   PSA 3.55 03/29/2023   INR 1.1 (H) 03/06/2016    CT CARDIAC SCORING ([facility] LOCATIONS ONLY) Result Date: 05/09/2023 CLINICAL DATA:  Dyslipidemia * Tracking Code: FCC * EXAM: CT CARDIAC CORONARY ARTERY CALCIUM SCORE TECHNIQUE: Non-contrast imaging through the heart was performed using prospective ECG gating. Image post processing was performed on an independent workstation, allowing for quantitative analysis of the heart and coronary arteries. Note that this exam targets the heart and the chest was not imaged in its entirety. COMPARISON:  None available. FINDINGS: CORONARY CALCIUM SCORES: Left Main: 0 LAD: 0 LCx: 0 RCA: 0 Total Agatston Score: 0 MESA database percentile: 0 AORTA MEASUREMENTS: Ascending Aorta: 2.9 cm Descending Aorta:2.2 cm OTHER FINDINGS: Heart is normal size. Aorta normal caliber. No adenopathy. Few punctate calcifications in the aortic root. No confluent airspace opacities or effusions. No acute findings in the upper abdomen. Chest wall soft tissues are unremarkable. No acute bony abnormality. IMPRESSION: No visible coronary artery calcifications. Total coronary calcium score of 0. Few punctate calcifications in the aortic root. No acute extra cardiac abnormality. Electronically Signed   By: Charlett Nose M.D.   On: 05/09/2023 16:57    Assessment & Plan:   Problem List Items Addressed This Visit     Well adult exam - Primary    We discussed age appropriate health related issues, including available/recomended screening tests and vaccinations. Labs were ordered to be later reviewed . All questions were answered. We discussed one or more of the following - seat belt  use, use of sunscreen/sun exposure exercise, safe sex, fall risk reduction, second hand smoke exposure, firearm use and storage, seat belt use, a need for adhering to healthy diet and exercise. Labs were ordered.  All questions were answered. Zoster vaccine option discussed Colon 2017 due in 2027 - Dr Christella Hartigan      Relevant Orders   TSH   Urinalysis   CBC with Differential/Platelet   Lipid panel   PSA   Comprehensive metabolic panel      Meds ordered this encounter  Medications   clotrimazole-betamethasone (LOTRISONE) cream    Sig: Apply 1 Application topically daily.  Dispense:  45 g    Refill:  1   omega-3 acid ethyl esters (LOVAZA) 1 g capsule    Sig: Take 2 capsules (2 g total) by mouth 2 (two) times daily. Annual appt due in Nov must see provider for future refills    Dispense:  360 capsule    Refill:  3      Follow-up: Return in about 6 months (around 09/04/2024) for a follow-up visit.  Sonda Primes, MD

## 2024-03-05 ENCOUNTER — Encounter: Payer: Self-pay | Admitting: Internal Medicine

## 2024-03-16 ENCOUNTER — Other Ambulatory Visit: Payer: Self-pay | Admitting: Internal Medicine

## 2024-03-19 ENCOUNTER — Telehealth: Payer: Self-pay | Admitting: Pharmacist

## 2024-03-19 NOTE — Telephone Encounter (Signed)
 Pharmacy Patient Advocate Encounter  Received notification from [facility] that Prior Authorization for Omega-3-acid Ethyl Esters 1GM capsules  has been APPROVED from 03/19/2024 to 03/19/2027

## 2024-03-19 NOTE — Telephone Encounter (Signed)
 Pharmacy Patient Advocate Encounter   Received notification from Patient Pharmacy that prior authorization for Omega-3-acid Ethyl Esters 1GM capsules is required/requested.   Insurance verification completed.   The patient is insured through [facility] .   Per test claim: PA required; PA submitted to above mentioned insurance via CoverMyMeds Key/confirmation #/EOC [id number]  Status is pending

## 2024-06-16 ENCOUNTER — Ambulatory Visit: Payer: Self-pay

## 2024-06-16 NOTE — Telephone Encounter (Signed)
       FYI Only or Action Required?: FYI only for provider.  Patient was last seen in primary care on 03/04/2024 by Plotnikov, Karlynn GAILS, MD. Called Nurse Triage reporting Insect Bite. Symptoms began today. Interventions attempted: Nothing. Symptoms are: gradually worsening. Bee stink to back just now. Lips and nose are swollen, pretty quick. No SOB or difficulty breathing. Will go to ED.   Triage Disposition: Go to ED or PCP/Alternative with Approval  Patient/caregiver understands and will follow disposition?: YesCopied from CRM [id number]. Topic: Clinical - Red Word Triage >> Jun 16, 2024  1:15 PM Robinson H wrote: [facility] that prompted transfer to Nurse Triage: sever allergic reaction to a bee or something, rash across whole body, lips double in size, nose double size Reason for Disposition  Patient sounds very sick or weak to the triager  Answer Assessment - Initial Assessment Questions 1. TYPE: What type of sting was it? (bee, yellow jacket, etc.)      Bee 2. ONSET: When did it occur?      Now 3. LOCATION: Where is the sting located?  How many stings?     back 4. SWELLING SIZE: How big is the swelling? (e.g., inches or cm)     Lips and nose 5. REDNESS: Is the area red or pink? If Yes, ask: What size is area of redness? (e.g., inches or cm). When did the redness start?     yes 6. PAIN: Is there any pain? If Yes, ask: How bad is it?  (Scale 1-10; or mild, moderate, severe)     yes 7. ITCHING: Is there any itching? If Yes, ask: How bad is it?      no 8. RESPIRATORY DISTRESS: Describe your breathing.     no 9. PRIOR REACTIONS: Have you had any severe allergic reactions to stings in the past? if yes, ask: What happened?     no 10. OTHER SYMPTOMS: Do you have any other symptoms? (e.g., abdomen pain, face or tongue swelling, new rash elsewhere, vomiting)       Swelling happened really fast. 11. PREGNANCY: Is there any chance you are pregnant?  When was your last menstrual period?       N/a  Protocols used: Bee or Yellow Jacket Sting-A-AH

## 2024-08-27 ENCOUNTER — Ambulatory Visit: Admitting: Internal Medicine

## 2024-08-27 ENCOUNTER — Encounter: Payer: Self-pay | Admitting: Internal Medicine

## 2024-08-27 VITALS — BP 110/70 | HR 74 | Temp 98.3°F | Ht 61.0 in | Wt 164.6 lb

## 2024-08-27 DIAGNOSIS — R7309 Other abnormal glucose: Secondary | ICD-10-CM

## 2024-08-27 DIAGNOSIS — E785 Hyperlipidemia, unspecified: Secondary | ICD-10-CM

## 2024-08-27 DIAGNOSIS — M542 Cervicalgia: Secondary | ICD-10-CM | POA: Diagnosis not present

## 2024-08-27 LAB — COMPREHENSIVE METABOLIC PANEL WITH GFR
ALT: 26 U/L (ref 0–53)
AST: 16 U/L (ref 0–37)
Albumin: 4.9 g/dL (ref 3.5–5.2)
Alkaline Phosphatase: 58 U/L (ref 39–117)
BUN: 13 mg/dL (ref 6–23)
CO2: 28 meq/L (ref 19–32)
Calcium: 10.2 mg/dL (ref 8.4–10.5)
Chloride: 100 meq/L (ref 96–112)
Creatinine, Ser: 1.04 mg/dL (ref 0.40–1.50)
GFR: 82.98 mL/min (ref >60.00)
Glucose, Bld: 81 mg/dL (ref 70–99)
Potassium: 3.9 meq/L (ref 3.5–5.1)
Sodium: 137 meq/L (ref 135–145)
Total Bilirubin: 1.1 mg/dL (ref 0.2–1.2)
Total Protein: 7.7 g/dL (ref 6.0–8.3)

## 2024-08-27 LAB — LIPID PANEL
Cholesterol: 221 mg/dL — ABNORMAL HIGH (ref 0–200)
HDL: 40.6 mg/dL (ref >39.00)
LDL Cholesterol: 157 mg/dL — ABNORMAL HIGH (ref 0–99)
NonHDL: 180.52
Total CHOL/HDL Ratio: 5
Triglycerides: 120 mg/dL (ref 0.0–149.0)
VLDL: 24 mg/dL (ref 0.0–40.0)

## 2024-08-27 LAB — HEMOGLOBIN A1C: Hgb A1c MFr Bld: 4.9 % (ref 4.6–6.5)

## 2024-08-27 NOTE — Patient Instructions (Addendum)
 USEFUL THINGS FOR ARTHRITIS and musculoskeletal pains:    A rice sock heating pad refers to a homemade heating pad created by filling a sock with uncooked rice, which can be heated in a microwave to provide a warm compress for sore muscles, pain relief, or other applications; essentially, it's a simple way to generate heat using readily available materials.  Key points about rice sock heat: How to make it: Fill a clean sock (preferably a tube sock) about 2/3 full with uncooked rice, tie a knot at the top to secure the rice inside.  Heating it up: Place the rice sock in the microwave and heat in short intervals (usually around 30 seconds at a time) until it reaches the desired warmth.  Important considerations: Check temperature before applying: Always test the temperature of the rice sock before applying it to your skin to avoid burns.  Use a towel to protect skin: Wrap the rice sock in a thin towel to distribute the heat evenly and protect your skin.  Uses: Muscle aches and pains  Menstrual cramps  Neck pain  Arthritis discomfort    Turmeric

## 2024-08-27 NOTE — Progress Notes (Signed)
 Subjective:  Patient ID: [name], male    DOB: [birth year]  Age: 53 y.o. MRN: [id number]  CC: Follow-up (Patient states nothing going on to discuss. )   HPI [name] presents for neck pain - new, stiff when working F/u dyslipidemia F/u elev glucose  Outpatient Medications Prior to Visit  Medication Sig Dispense Refill   Cholecalciferol (VITAMIN D3) 50 MCG (2000 UT) CAPS Take 1 capsule (2,000 Units total) by mouth daily. 100 capsule 3   omega-3 acid ethyl esters (LOVAZA ) 1 g capsule TAKE 2 CAPSULES BY MOUTH TWICE A DAY 360 capsule 3   phentermine  (ADIPEX-P ) 37.5 MG tablet TAKE 1 TABLET BY MOUTH DAILY BEFORE BREAKFAST 30 tablet 2   Vitamin D , Ergocalciferol , (DRISDOL ) 1.25 MG (50000 UNIT) CAPS capsule Take 1 capsule (50,000 Units total) by mouth every 7 (seven) days. 6 capsule 0   clotrimazole -betamethasone  (LOTRISONE ) cream Apply 1 Application topically daily. (Patient not taking: Reported on 08/27/2024) 45 g 1   No facility-administered medications prior to visit.    ROS: Review of Systems  Constitutional:  Negative for appetite change, fatigue and unexpected weight change.  HENT:  Negative for congestion, nosebleeds, sneezing, sore throat and trouble swallowing.   Eyes:  Negative for itching and visual disturbance.  Respiratory:  Negative for cough.   Cardiovascular:  Negative for chest pain, palpitations and leg swelling.  Gastrointestinal:  Negative for abdominal distention, blood in stool, diarrhea and nausea.  Genitourinary:  Negative for frequency and hematuria.  Musculoskeletal:  Negative for back pain, gait problem, joint swelling and neck pain.  Skin:  Negative for rash.  Neurological:  Negative for dizziness, tremors, speech difficulty and weakness.  Psychiatric/Behavioral:  Negative for agitation, dysphoric mood, sleep disturbance and suicidal ideas. The patient is not nervous/anxious.     Objective:  BP 110/70   Pulse 74   Temp 98.3 F (36.8 C) (Oral)    Ht 5' 1 (1.549 m)   Wt 164 lb 9.6 oz (74.7 kg)   SpO2 96%   BMI 31.10 kg/m   BP Readings from Last 3 Encounters:  08/27/24 110/70  03/04/24 110/78  09/30/23 116/80    Wt Readings from Last 3 Encounters:  08/27/24 164 lb 9.6 oz (74.7 kg)  03/04/24 159 lb (72.1 kg)  09/30/23 154 lb (69.9 kg)    Physical Exam Constitutional:      General: He is not in acute distress.    Appearance: He is well-developed.     Comments: NAD  Eyes:     Conjunctiva/sclera: Conjunctivae normal.     Pupils: Pupils are equal, round, and reactive to light.  Neck:     Thyroid : No thyromegaly.     Vascular: No JVD.  Cardiovascular:     Rate and Rhythm: Normal rate and regular rhythm.     Heart sounds: Normal heart sounds. No murmur heard.    No friction rub. No gallop.  Pulmonary:     Effort: Pulmonary effort is normal. No respiratory distress.     Breath sounds: Normal breath sounds. No wheezing or rales.  Chest:     Chest wall: No tenderness.  Abdominal:     General: Bowel sounds are normal. There is no distension.     Palpations: Abdomen is soft. There is no mass.     Tenderness: There is no abdominal tenderness. There is no guarding or rebound.  Musculoskeletal:        General: No tenderness. Normal range of motion.  Cervical back: Normal range of motion.     Right lower leg: No edema.     Left lower leg: No edema.  Lymphadenopathy:     Cervical: No cervical adenopathy.  Skin:    General: Skin is warm and dry.     Findings: No rash.  Neurological:     Mental Status: He is alert and oriented to person, place, and time.     Cranial Nerves: No cranial nerve deficit.     Motor: No abnormal muscle tone.     Coordination: Coordination normal.     Gait: Gait normal.     Deep Tendon Reflexes: Reflexes are normal and symmetric.  Psychiatric:        Behavior: Behavior normal.        Thought Content: Thought content normal.        Judgment: Judgment normal.   Neck w/good ROM  Lab  Results  Component Value Date   WBC 8.9 03/04/2024   HGB 16.8 03/04/2024   HCT 46.9 03/04/2024   PLT 299.0 03/04/2024   GLUCOSE 114 (H) 03/04/2024   CHOL 203 (H) 03/04/2024   TRIG 107.0 03/04/2024   HDL 40.40 03/04/2024   LDLDIRECT 134.0 10/14/2020   LDLCALC 142 (H) 03/04/2024   ALT 30 03/04/2024   AST 17 03/04/2024   NA 138 03/04/2024   K 4.0 03/04/2024   CL 101 03/04/2024   CREATININE 0.97 03/04/2024   BUN 8 03/04/2024   CO2 30 03/04/2024   TSH 1.60 03/04/2024   PSA 3.31 03/04/2024   INR 1.1 (H) 03/06/2016    CT CARDIAC SCORING ([facility] LOCATIONS ONLY) Result Date: 05/09/2023 CLINICAL DATA:  Dyslipidemia * Tracking Code: FCC * EXAM: CT CARDIAC CORONARY ARTERY CALCIUM SCORE TECHNIQUE: Non-contrast imaging through the heart was performed using prospective ECG gating. Image post processing was performed on an independent workstation, allowing for quantitative analysis of the heart and coronary arteries. Note that this exam targets the heart and the chest was not imaged in its entirety. COMPARISON:  None available. FINDINGS: CORONARY CALCIUM SCORES: Left Main: 0 LAD: 0 LCx: 0 RCA: 0 Total Agatston Score: 0 MESA database percentile: 0 AORTA MEASUREMENTS: Ascending Aorta: 2.9 cm Descending Aorta:2.2 cm OTHER FINDINGS: Heart is normal size. Aorta normal caliber. No adenopathy. Few punctate calcifications in the aortic root. No confluent airspace opacities or effusions. No acute findings in the upper abdomen. Chest wall soft tissues are unremarkable. No acute bony abnormality. IMPRESSION: No visible coronary artery calcifications. Total coronary calcium score of 0. Few punctate calcifications in the aortic root. No acute extra cardiac abnormality. Electronically Signed   By: Franky Crease M.D.   On: 05/09/2023 16:57    Assessment & Plan:   Problem List Items Addressed This Visit     Dyslipidemia   Relevant Orders   Lipid panel   Elevated glucose   Relevant Orders   Comprehensive metabolic  panel with GFR   Hemoglobin A1c   Neck pain - Primary   Neck pillow Heat         No orders of the defined types were placed in this encounter.     Follow-up: Return in about 6 months (around 02/24/2025) for Wellness Exam.  Richard Noel, MD

## 2024-08-27 NOTE — Assessment & Plan Note (Signed)
 Neck pillow Heat

## 2024-08-31 ENCOUNTER — Ambulatory Visit: Payer: Self-pay | Admitting: Internal Medicine

## 2024-10-20 ENCOUNTER — Other Ambulatory Visit: Payer: Self-pay | Admitting: Internal Medicine
